# Patient Record
Sex: Female | Born: 1995 | Race: Black or African American | Hispanic: No | Marital: Single | Smoking: Former smoker
Health system: Southern US, Community
[De-identification: ages and names within clinical notes are randomized; demographics above are authoritative.]

## PROBLEM LIST (undated history)

## (undated) ENCOUNTER — Inpatient Hospital Stay (HOSPITAL_COMMUNITY): Payer: Self-pay

## (undated) DIAGNOSIS — E162 Hypoglycemia, unspecified: Secondary | ICD-10-CM

## (undated) DIAGNOSIS — D649 Anemia, unspecified: Secondary | ICD-10-CM

## (undated) DIAGNOSIS — E559 Vitamin D deficiency, unspecified: Secondary | ICD-10-CM

## (undated) DIAGNOSIS — A749 Chlamydial infection, unspecified: Secondary | ICD-10-CM

## (undated) DIAGNOSIS — R519 Headache, unspecified: Secondary | ICD-10-CM

## (undated) DIAGNOSIS — A549 Gonococcal infection, unspecified: Secondary | ICD-10-CM

## (undated) DIAGNOSIS — I10 Essential (primary) hypertension: Secondary | ICD-10-CM

## (undated) DIAGNOSIS — N83209 Unspecified ovarian cyst, unspecified side: Secondary | ICD-10-CM

## (undated) DIAGNOSIS — E785 Hyperlipidemia, unspecified: Secondary | ICD-10-CM

## (undated) HISTORY — DX: Vitamin D deficiency, unspecified: E55.9

## (undated) HISTORY — DX: Hyperlipidemia, unspecified: E78.5

## (undated) HISTORY — PX: HERNIA REPAIR: SHX51

## (undated) HISTORY — PX: UMBILICAL HERNIA REPAIR: SHX196

---
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
## 2016-01-27 ENCOUNTER — Ambulatory Visit (INDEPENDENT_AMBULATORY_CARE_PROVIDER_SITE_OTHER): Payer: BLUE CROSS/BLUE SHIELD | Admitting: Family Medicine

## 2016-01-27 VITALS — BP 132/88 | HR 97 | Temp 98.2°F | Resp 16 | Ht 64.0 in | Wt 137.0 lb

## 2016-01-27 DIAGNOSIS — R350 Frequency of micturition: Secondary | ICD-10-CM

## 2016-01-27 DIAGNOSIS — R102 Pelvic and perineal pain: Secondary | ICD-10-CM

## 2016-01-27 DIAGNOSIS — N938 Other specified abnormal uterine and vaginal bleeding: Secondary | ICD-10-CM

## 2016-01-27 DIAGNOSIS — N898 Other specified noninflammatory disorders of vagina: Secondary | ICD-10-CM

## 2016-01-27 DIAGNOSIS — R1030 Lower abdominal pain, unspecified: Secondary | ICD-10-CM | POA: Diagnosis not present

## 2016-01-27 LAB — COMPREHENSIVE METABOLIC PANEL
ALBUMIN: 4.8 g/dL (ref 3.6–5.1)
ALK PHOS: 50 U/L (ref 47–176)
ALT: 10 U/L (ref 5–32)
AST: 15 U/L (ref 12–32)
BILIRUBIN TOTAL: 0.9 mg/dL (ref 0.2–1.1)
BUN: 9 mg/dL (ref 7–20)
CO2: 21 mmol/L (ref 20–31)
CREATININE: 0.89 mg/dL (ref 0.50–1.00)
Calcium: 10.1 mg/dL (ref 8.9–10.4)
Chloride: 105 mmol/L (ref 98–110)
Glucose, Bld: 71 mg/dL (ref 65–99)
Potassium: 4.4 mmol/L (ref 3.8–5.1)
Sodium: 143 mmol/L (ref 135–146)
TOTAL PROTEIN: 7.7 g/dL (ref 6.3–8.2)

## 2016-01-27 LAB — POCT CBC
Granulocyte percent: 63.5 %G (ref 37–80)
HEMATOCRIT: 37.5 % — AB (ref 37.7–47.9)
Hemoglobin: 13 g/dL (ref 12.2–16.2)
LYMPH, POC: 2.3 (ref 0.6–3.4)
MCH, POC: 27.9 pg (ref 27–31.2)
MCHC: 34.7 g/dL (ref 31.8–35.4)
MCV: 80.5 fL (ref 80–97)
MID (CBC): 0.3 (ref 0–0.9)
MPV: 7.4 fL (ref 0–99.8)
POC GRANULOCYTE: 4.5 (ref 2–6.9)
POC LYMPH %: 32.7 % (ref 10–50)
POC MID %: 3.8 % (ref 0–12)
Platelet Count, POC: 349 10*3/uL (ref 142–424)
RBC: 4.67 M/uL (ref 4.04–5.48)
RDW, POC: 12.9 %
WBC: 7.1 10*3/uL (ref 4.6–10.2)

## 2016-01-27 LAB — POCT URINALYSIS DIP (MANUAL ENTRY)
Glucose, UA: NEGATIVE
Leukocytes, UA: NEGATIVE
Nitrite, UA: NEGATIVE
PH UA: 6.5
Protein Ur, POC: >=300 — AB
Urobilinogen, UA: 0.2

## 2016-01-27 LAB — POC MICROSCOPIC URINALYSIS (UMFC)

## 2016-01-27 MED ORDER — AZITHROMYCIN 500 MG PO TABS: 1000.0000 mg | ORAL_TABLET | Freq: Every day | ORAL | Status: DC

## 2016-01-27 MED ORDER — METRONIDAZOLE 500 MG PO TABS: 500.0000 mg | ORAL_TABLET | Freq: Two times a day (BID) | ORAL | Status: DC

## 2016-01-27 MED ORDER — DOXYCYCLINE HYCLATE 100 MG PO CAPS: 100.0000 mg | ORAL_CAPSULE | Freq: Two times a day (BID) | ORAL | Status: DC

## 2016-01-27 NOTE — Patient Instructions (Addendum)
   IF you received an x-ray today, you will receive an invoice from [facility]. Please contact [facility] at [phone] with questions or concerns regarding your invoice.   IF you received labwork today, you will receive an invoice from [facility]. Please contact [facility] at [phone] with questions or concerns regarding your invoice.   Our billing staff will not be able to assist you with questions regarding bills from these companies.  You will be contacted with the lab results as soon as they are available. The fastest way to get your results is to activate your My Chart account. Instructions are located on the last page of this paperwork. If you have not heard from us regarding the results in 2 weeks, please contact this office.      Pelvic Inflammatory Disease Pelvic inflammatory disease (PID) refers to an infection in some or all of the female organs. The infection can be in the uterus, ovaries, fallopian tubes, or the surrounding tissues in the pelvis. PID can cause abdominal or pelvic pain that comes on suddenly (acute pelvic pain). PID is a serious infection because it can lead to lasting (chronic) pelvic pain or the inability to have children (infertility). CAUSES This condition is most often caused by an infection that is spread during sexual contact. However, the infection can also be caused by the normal bacteria that are found in the vaginal tissues if these bacteria travel upward into the reproductive organs. PID can also occur following:  The birth of a baby.  A miscarriage.  An abortion.  Major pelvic surgery.  The use of an intrauterine device (IUD).  A sexual assault. RISK FACTORS This condition is more likely to develop in women who:  Are younger than 19 years of age.  Are sexually active at ayoung age.  Use nonbarrier contraception.  Have multiple sexual partners.  Have sex with someone who has  symptoms of an STD (sexually transmitted disease).  Use oral contraception. At times, certain behaviors can also increase the possibility of getting PID, such as:  Using a vaginal douche.  Having an IUD in place. SYMPTOMS Symptoms of this condition include:  Abdominal or pelvic pain.  Fever.  Chills.  Abnormal vaginal discharge.  Abnormal uterine bleeding.  Unusual pain shortly after the end of a menstrual period.  Painful urination.  Pain with sexual intercourse.  Nausea and vomiting. DIAGNOSIS To diagnose this condition, your health care provider will do a physical exam and take your medical history. A pelvic exam typically reveals great tenderness in the uterus and the surrounding pelvic tissues. You may also have tests, such as:  Lab tests, including a pregnancy test, blood tests, and urine test.  Culture tests of the vagina and cervix to check for an STD.  Ultrasound.  A laparoscopic procedure to look inside the pelvis.  Examining vaginal secretions under a microscope. TREATMENT Treatment for this condition may involve one or more approaches.  Antibiotic medicines may be prescribed to be taken by mouth.  Sexual partners may need to be treated if the infection is caused by an STD.  For more severe cases, hospitalization may be needed to give antibiotics directly into a vein through an IV tube.  Surgery may be needed if other treatments do not help, but this is rare. It may take weeks until you are completely well. If you are diagnosed with PID, you should also be checked for human immunodeficiency virus (HIV). Your health care provider may test   again 3 months after treatment. You should not have unprotected sex. HOME CARE INSTRUCTIONS  Take over-the-counter and prescription medicines only as told by your health care provider.  If you were prescribed an antibiotic medicine, take it as told by your health care provider. Do not stop taking the antibiotic  even if you start to feel better.  Do not have sexual intercourse until treatment is completed or as told by your health care provider. If PID is confirmed, your recent sexual partners will need treatment, especially if you had unprotected sex.  Keep all follow-up visits as told by your health care provider. This is important. SEEK MEDICAL CARE IF:  You have increased or abnormal vaginal discharge.  Your pain does not improve.  You vomit.  You have a fever.  You cannot tolerate your medicines.  Your partner has an STD.  You have pain when you urinate. SEEK IMMEDIATE MEDICAL CARE IF:  You have increased abdominal or pelvic pain.  You have chills.  Your symptoms are not better in 72 hours even with treatment.   This information is not intended to replace advice given to you by your health care provider. Make sure you discuss any questions you have with your health care provider.   Document Released: 09/04/2005 Document Revised: 05/26/2015 Document Reviewed: 10/12/2014 Elsevier Interactive Patient Education [employer]2016 Else[employer] Inc.

## 2016-01-27 NOTE — Progress Notes (Addendum)
Subjective:    Patient ID: Monique Keith, female    DOB: 06/21/1996, 20 y.o.   MRN: [id number] By signing my name below, I, Monique Keith, attest that this documentation has been prepared under the direction and in the presence of Monique SorensonEva Shaw, MD. Electronically Signed: Javier Dockerobert Ryan Keith, ER Scribe. 01/27/2016. 1:54 PM.  Chief Complaint  Patient presents with  . Exposure to STD    pt. wants test for chlamydia    HPI HPI Comments: Monique ProctorMiyanna Gonnella is a 20 y.o. female who [facility] complaining of vaginal discharge and odor reminescent of her prior chlamydia infection several months ago. She did have intercourse with the same person after her treatment - she thinks he was treated as well but he is no longer in the picture.  She is experience a vaginal odor with a yellow discharge, urinary frequency, dysuria, and some flank pain and abdominal pain.  She is freq nauseated. Normal bowels. Periods very irregular, + bleeding after intercourse and dyspareunia. Bleeidng frequently and heavily  Pt is here with a 5 mo infant today - her sister's child - so pt declines a pelvic exam   Past Medical History  Diagnosis Date  . Hypoglycemia   . Hypertension    No Known Allergies  No current outpatient prescriptions on file prior to visit.   No current facility-administered medications on file prior to visit.   Past Surgical History  Procedure Laterality Date  . Umbilical hernia repair     History reviewed. No pertinent family history. Social History   Social History  . Marital Status: Single    Spouse Name: N/A  . Number of Children: N/A  . Years of Education: N/A   Social History Main Topics  . Smoking status: Never Smoker   . Smokeless tobacco: Never Used  . Alcohol Use: No  . Drug Use: No  . Sexual Activity: Yes    Birth Control/ Protection: Pill   Other Topics Concern  . None   Social History Narrative     Review of Systems  Constitutional: Negative for fever,  chills, diaphoresis, activity change and appetite change.  Gastrointestinal: Positive for nausea and abdominal pain. Negative for vomiting, diarrhea, constipation and abdominal distention.  Genitourinary: Positive for dysuria, frequency, flank pain, vaginal bleeding, vaginal discharge, vaginal pain, menstrual problem and dyspareunia. Negative for genital sores.  Hematological: Negative for adenopathy.       Objective:  BP 132/88 mmHg  Pulse 97  Temp(Src) 98.2 F (36.8 C) (Oral)  Resp 16  Ht 5\' 4"  (1.626 m)  Wt 137 lb (62.143 kg)  BMI 23.50 kg/m2  SpO2 99%  Physical Exam  Constitutional: She is oriented to person, place, and time. She appears well-developed and well-nourished. No distress.  HENT:  Head: Normocephalic and atraumatic.  Eyes: Pupils are equal, round, and reactive to light.  Neck: Neck supple.  Cardiovascular: Normal rate.   Pulmonary/Chest: Effort normal. No respiratory distress.  Musculoskeletal: Normal range of motion.  Neurological: She is alert and oriented to person, place, and time. Coordination normal.  Skin: Skin is warm and dry. She is not diaphoretic.  Psychiatric: She has a normal mood and affect. Her behavior is normal.  Nursing note and vitals reviewed.     Assessment & Plan:   Concern for pid so will treat for chlamydia as well as start doxy and flagyl. Recheck in 2-3 wks after antibiotic course is complete.  Recommended IM ceftriaxone but pt refuses, will come back  if gonorrhea is +.   Recommended pelvic exam but pt refuses today as she has infant with her, also does not want a self-collected wet prep - agrees to do pelvic at f/u OV.  Recheck UA at f/u OV as well - sooner if sxs persist. Dehydrated -> increase h20.  1. Vaginal discharge   2. Urinary frequency   3. Pelvic pain in female   4. Dysfunctional uterine bleeding   5. Lower abdominal pain     Orders Placed This Encounter  Procedures  . GC/Chlamydia Probe Amp    Order Specific  Question:  Source    Answer:  urine  . Trichomonas vaginalis, RNA  . HIV antibody  . RPR  . Hepatitis C Antibody  . Comprehensive metabolic panel  . POCT CBC  . POCT urinalysis dipstick  . POCT Microscopic Urinalysis ([facility])    Meds ordered this encounter  Medications  . medroxyPROGESTERone (DEPO-PROVERA) 150 MG/ML injection    Sig: Inject 150 mg into the muscle every 3 (three) months.  Marland Kitchen azithromycin (ZITHROMAX) 500 MG tablet    Sig: Take 2 tablets (1,000 mg total) by mouth daily.    Dispense:  2 tablet    Refill:  0  . metroNIDAZOLE (FLAGYL) 500 MG tablet    Sig: Take 1 tablet (500 mg total) by mouth 2 (two) times daily.    Dispense:  28 tablet    Refill:  0  . doxycycline (VIBRAMYCIN) 100 MG capsule    Sig: Take 1 capsule (100 mg total) by mouth 2 (two) times daily.    Dispense:  28 capsule    Refill:  0     Monique Sorenson, MD MPH   Results for orders placed or performed in visit on 01/27/16  GC/Chlamydia Probe Amp  Result Value Ref Range   CT Probe RNA NOT DETECTED    GC Probe RNA NOT DETECTED   HIV antibody  Result Value Ref Range   HIV 1&2 Ab, 4th Generation NONREACTIVE NONREACTIVE  RPR  Result Value Ref Range   RPR Ser Ql NON REAC NON REAC  Hepatitis C Antibody  Result Value Ref Range   HCV Ab NEGATIVE NEGATIVE  Comprehensive metabolic panel  Result Value Ref Range   Sodium 143 135 - 146 mmol/L   Potassium 4.4 3.8 - 5.1 mmol/L   Chloride 105 98 - 110 mmol/L   CO2 21 20 - 31 mmol/L   Glucose, Bld 71 65 - 99 mg/dL   BUN 9 7 - 20 mg/dL   Creat 1.61 0.96 - 0.45 mg/dL   Total Bilirubin 0.9 0.2 - 1.1 mg/dL   Alkaline Phosphatase 50 47 - 176 U/L   AST 15 12 - 32 U/L   ALT 10 5 - 32 U/L   Total Protein 7.7 6.3 - 8.2 g/dL   Albumin 4.8 3.6 - 5.1 g/dL   Calcium 40.9 8.9 - 81.1 mg/dL  POCT CBC  Result Value Ref Range   WBC 7.1 4.6 - 10.2 K/uL   Lymph, poc 2.3 0.6 - 3.4   POC LYMPH PERCENT 32.7 10 - 50 %L   MID (cbc) 0.3 0 - 0.9   POC MID % 3.8 0 - 12 %M    POC Granulocyte 4.5 2 - 6.9   Granulocyte percent 63.5 37 - 80 %G   RBC 4.67 4.04 - 5.48 M/uL   Hemoglobin 13.0 12.2 - 16.2 g/dL   HCT, POC 91.4 (A) 78.2 - 47.9 %   MCV 80.5  80 - 97 fL   MCH, POC 27.9 27 - 31.2 pg   MCHC 34.7 31.8 - 35.4 g/dL   RDW, POC 16.1 %   Platelet Count, POC 349 142 - 424 K/uL   MPV 7.4 0 - 99.8 fL  POCT urinalysis dipstick  Result Value Ref Range   Color, UA yellow yellow   Clarity, UA hazy (A) clear   Glucose, UA negative negative   Bilirubin, UA small (A) negative   Ketones, POC UA >= (160) (A) negative   Spec Grav, UA >=1.030    Blood, UA large (A) negative   pH, UA 6.5    Protein Ur, POC >=300 (A) negative   Urobilinogen, UA 0.2    Nitrite, UA Negative Negative   Leukocytes, UA Negative Negative  POCT Microscopic Urinalysis ([facility])  Result Value Ref Range   WBC,UR,HPF,POC Many (A) None WBC/hpf   RBC,UR,HPF,POC Few (A) None RBC/hpf   Bacteria Many (A) None, Too numerous to count   Mucus Present (A) Absent   Epithelial Cells, UR Per Microscopy Many (A) None, Too numerous to count cells/hpf

## 2016-01-28 ENCOUNTER — Encounter: Payer: Self-pay | Admitting: Family Medicine

## 2016-01-28 LAB — GC/CHLAMYDIA PROBE AMP
CT Probe RNA: NOT DETECTED
GC Probe RNA: NOT DETECTED

## 2016-01-28 LAB — RPR

## 2016-01-28 LAB — HEPATITIS C ANTIBODY: HCV Ab: NEGATIVE

## 2016-01-28 LAB — HIV ANTIBODY (ROUTINE TESTING W REFLEX): HIV 1&2 Ab, 4th Generation: NONREACTIVE

## 2016-01-29 LAB — TRICHOMONAS VAGINALIS, PROBE AMP: T vaginalis RNA: NEGATIVE

## 2016-01-31 ENCOUNTER — Telehealth: Payer: Self-pay

## 2016-01-31 NOTE — Telephone Encounter (Signed)
The prescriptions were all sent directly over to her pharmacy so I guess she was unaware of this or she lost the pill bottles. Either way - all of her tests std came back negative so if she is still having symptoms, she needs to come back in for a pelvic exam as could be something else like BV or yeast. Will consider replacing the metronidazole but no indication for the doxy or azithro due to neg gc/chlam tests.

## 2016-01-31 NOTE — Telephone Encounter (Signed)
Pt is needing a refill on her medication that was prescribed for a STD she lost the RX   Best number [phone]

## 2016-01-31 NOTE — Telephone Encounter (Signed)
Dr. Shaw please review and advise. 

## 2016-02-01 NOTE — Telephone Encounter (Signed)
Left message for pt to call back  °

## 2016-06-25 ENCOUNTER — Emergency Department (HOSPITAL_COMMUNITY)
Admission: EM | Admit: 2016-06-25 | Discharge: 2016-06-26 | Disposition: A | Discharge status: Home / Self Care | Payer: BLUE CROSS/BLUE SHIELD | Attending: Emergency Medicine | Admitting: Emergency Medicine

## 2016-06-25 ENCOUNTER — Encounter (HOSPITAL_COMMUNITY): Payer: Self-pay | Admitting: Emergency Medicine

## 2016-06-25 ENCOUNTER — Ambulatory Visit (HOSPITAL_COMMUNITY)
Admission: EM | Admit: 2016-06-25 | Discharge: 2016-06-25 | Disposition: A | Discharge status: Nursing Home (Includes ICF) | Payer: BLUE CROSS/BLUE SHIELD | Attending: Internal Medicine | Admitting: Internal Medicine

## 2016-06-25 ENCOUNTER — Emergency Department (HOSPITAL_COMMUNITY): Payer: BLUE CROSS/BLUE SHIELD

## 2016-06-25 DIAGNOSIS — Y939 Activity, unspecified: Secondary | ICD-10-CM | POA: Insufficient documentation

## 2016-06-25 DIAGNOSIS — Z79899 Other long term (current) drug therapy: Secondary | ICD-10-CM | POA: Insufficient documentation

## 2016-06-25 DIAGNOSIS — S161XXD Strain of muscle, fascia and tendon at neck level, subsequent encounter: Secondary | ICD-10-CM | POA: Diagnosis not present

## 2016-06-25 DIAGNOSIS — G44319 Acute post-traumatic headache, not intractable: Secondary | ICD-10-CM

## 2016-06-25 DIAGNOSIS — S0990XA Unspecified injury of head, initial encounter: Secondary | ICD-10-CM

## 2016-06-25 DIAGNOSIS — M7918 Myalgia, other site: Secondary | ICD-10-CM

## 2016-06-25 DIAGNOSIS — Y92410 Unspecified street and highway as the place of occurrence of the external cause: Secondary | ICD-10-CM | POA: Diagnosis not present

## 2016-06-25 DIAGNOSIS — M25511 Pain in right shoulder: Secondary | ICD-10-CM | POA: Diagnosis not present

## 2016-06-25 DIAGNOSIS — I10 Essential (primary) hypertension: Secondary | ICD-10-CM | POA: Diagnosis not present

## 2016-06-25 DIAGNOSIS — Y999 Unspecified external cause status: Secondary | ICD-10-CM | POA: Insufficient documentation

## 2016-06-25 DIAGNOSIS — M549 Dorsalgia, unspecified: Secondary | ICD-10-CM | POA: Insufficient documentation

## 2016-06-25 DIAGNOSIS — R1030 Lower abdominal pain, unspecified: Secondary | ICD-10-CM | POA: Diagnosis not present

## 2016-06-25 DIAGNOSIS — M25512 Pain in left shoulder: Secondary | ICD-10-CM

## 2016-06-25 LAB — COMPREHENSIVE METABOLIC PANEL
ALK PHOS: 47 U/L (ref 38–126)
ALT: 10 U/L — ABNORMAL LOW (ref 14–54)
ANION GAP: 9 (ref 5–15)
AST: 16 U/L (ref 15–41)
Albumin: 4.6 g/dL (ref 3.5–5.0)
BILIRUBIN TOTAL: 0.7 mg/dL (ref 0.3–1.2)
BUN: 11 mg/dL (ref 6–20)
CALCIUM: 9.6 mg/dL (ref 8.9–10.3)
CO2: 21 mmol/L — AB (ref 22–32)
Chloride: 109 mmol/L (ref 101–111)
Creatinine, Ser: 0.83 mg/dL (ref 0.44–1.00)
Glucose, Bld: 77 mg/dL (ref 65–99)
Potassium: 3.6 mmol/L (ref 3.5–5.1)
SODIUM: 139 mmol/L (ref 135–145)
TOTAL PROTEIN: 7.9 g/dL (ref 6.5–8.1)

## 2016-06-25 LAB — CBC WITH DIFFERENTIAL/PLATELET
Basophils Absolute: 0 10*3/uL (ref 0.0–0.1)
Basophils Relative: 1 %
EOS ABS: 0.1 10*3/uL (ref 0.0–0.7)
Eosinophils Relative: 2 %
HCT: 35.2 % — ABNORMAL LOW (ref 36.0–46.0)
HEMOGLOBIN: 12.1 g/dL (ref 12.0–15.0)
LYMPHS ABS: 2.5 10*3/uL (ref 0.7–4.0)
Lymphocytes Relative: 43 %
MCH: 27.5 pg (ref 26.0–34.0)
MCHC: 34.4 g/dL (ref 30.0–36.0)
MCV: 80 fL (ref 78.0–100.0)
MONOS PCT: 8 %
Monocytes Absolute: 0.5 10*3/uL (ref 0.1–1.0)
Neutro Abs: 2.7 10*3/uL (ref 1.7–7.7)
Neutrophils Relative %: 46 %
Platelets: 327 10*3/uL (ref 150–400)
RBC: 4.4 MIL/uL (ref 3.87–5.11)
RDW: 12.9 % (ref 11.5–15.5)
WBC: 5.8 10*3/uL (ref 4.0–10.5)

## 2016-06-25 LAB — I-STAT BETA HCG BLOOD, ED (MC, WL, AP ONLY): I-stat hCG, quantitative: <5 m[IU]/mL (ref <5)

## 2016-06-25 MED ORDER — HYDROCODONE-ACETAMINOPHEN 5-325 MG PO TABS
1.0000 | ORAL_TABLET | Freq: Once | ORAL | Status: AC
  Administered 2016-06-25: 1 via ORAL
  Filled 2016-06-25: qty 1

## 2016-06-25 MED ORDER — SODIUM CHLORIDE 0.9 % IV SOLN
Freq: Once | INTRAVENOUS | Status: AC
  Administered 2016-06-26: via INTRAVENOUS
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
## 2016-06-26 ENCOUNTER — Emergency Department (HOSPITAL_COMMUNITY): Payer: BLUE CROSS/BLUE SHIELD

## 2016-06-26 ENCOUNTER — Encounter (HOSPITAL_COMMUNITY): Payer: Self-pay

## 2016-06-26 MED ORDER — HYDROCODONE-ACETAMINOPHEN 5-325 MG PO TABS: 1.0000 | ORAL_TABLET | ORAL | 0 refills | Status: DC | PRN

## 2016-06-26 MED ORDER — IBUPROFEN 400 MG PO TABS: 400.0000 mg | ORAL_TABLET | Freq: Four times a day (QID) | ORAL | 0 refills | Status: DC | PRN

## 2016-06-26 MED ORDER — IOPAMIDOL (ISOVUE-300) INJECTION 61%
100.0000 mL | Freq: Once | INTRAVENOUS | Status: AC | PRN
  Administered 2016-06-26: 100 mL via INTRAVENOUS
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
## 2016-06-28 ENCOUNTER — Ambulatory Visit (HOSPITAL_COMMUNITY)
Admission: EM | Admit: 2016-06-28 | Discharge: 2016-06-28 | Disposition: A | Discharge status: Home / Self Care | Payer: BLUE CROSS/BLUE SHIELD | Attending: Physician Assistant | Admitting: Physician Assistant

## 2016-06-28 ENCOUNTER — Encounter (HOSPITAL_COMMUNITY): Payer: Self-pay | Admitting: Emergency Medicine

## 2016-06-28 ENCOUNTER — Ambulatory Visit (INDEPENDENT_AMBULATORY_CARE_PROVIDER_SITE_OTHER): Payer: Self-pay

## 2016-06-28 DIAGNOSIS — S43401A Unspecified sprain of right shoulder joint, initial encounter: Secondary | ICD-10-CM | POA: Diagnosis not present

## 2016-06-28 MED ORDER — DICLOFENAC POTASSIUM 50 MG PO TABS: 50.0000 mg | ORAL_TABLET | Freq: Three times a day (TID) | ORAL | 1 refills | Status: DC
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
## 2016-07-04 ENCOUNTER — Ambulatory Visit (HOSPITAL_COMMUNITY)
Admission: EM | Admit: 2016-07-04 | Discharge: 2016-07-04 | Disposition: A | Discharge status: Home / Self Care | Payer: BLUE CROSS/BLUE SHIELD | Attending: Family Medicine | Admitting: Family Medicine

## 2016-07-04 ENCOUNTER — Encounter (HOSPITAL_COMMUNITY): Payer: Self-pay | Admitting: Emergency Medicine

## 2016-07-04 DIAGNOSIS — S46911D Strain of unspecified muscle, fascia and tendon at shoulder and upper arm level, right arm, subsequent encounter: Secondary | ICD-10-CM

## 2016-07-04 NOTE — Discharge Instructions (Signed)
You can be seen at the after hours orthopedic clinic after 5 pm today (see the handout).  They will set you up with further guidelines including work note and physical therapy
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
## 2016-11-01 ENCOUNTER — Emergency Department (HOSPITAL_COMMUNITY): Payer: BLUE CROSS/BLUE SHIELD

## 2016-11-01 ENCOUNTER — Encounter (HOSPITAL_COMMUNITY): Payer: Self-pay | Admitting: Emergency Medicine

## 2016-11-01 ENCOUNTER — Emergency Department (HOSPITAL_COMMUNITY)
Admission: EM | Admit: 2016-11-01 | Discharge: 2016-11-01 | Disposition: A | Discharge status: Home / Self Care | Payer: BLUE CROSS/BLUE SHIELD | Attending: Emergency Medicine | Admitting: Emergency Medicine

## 2016-11-01 DIAGNOSIS — I10 Essential (primary) hypertension: Secondary | ICD-10-CM | POA: Diagnosis not present

## 2016-11-01 DIAGNOSIS — R05 Cough: Secondary | ICD-10-CM | POA: Diagnosis present

## 2016-11-01 DIAGNOSIS — J40 Bronchitis, not specified as acute or chronic: Secondary | ICD-10-CM | POA: Diagnosis not present

## 2016-11-01 DIAGNOSIS — J9801 Acute bronchospasm: Secondary | ICD-10-CM | POA: Insufficient documentation

## 2016-11-01 LAB — I-STAT CHEM 8, ED
BUN: 7 mg/dL (ref 6–20)
CHLORIDE: 108 mmol/L (ref 101–111)
CREATININE: 0.8 mg/dL (ref 0.44–1.00)
Calcium, Ion: 1.21 mmol/L (ref 1.15–1.40)
Glucose, Bld: 80 mg/dL (ref 65–99)
HEMATOCRIT: 35 % — AB (ref 36.0–46.0)
Hemoglobin: 11.9 g/dL — ABNORMAL LOW (ref 12.0–15.0)
POTASSIUM: 3.5 mmol/L (ref 3.5–5.1)
SODIUM: 141 mmol/L (ref 135–145)
TCO2: 23 mmol/L (ref 0–100)

## 2016-11-01 LAB — I-STAT BETA HCG BLOOD, ED (MC, WL, AP ONLY)

## 2016-11-01 LAB — D-DIMER, QUANTITATIVE: D-Dimer, Quant: 0.33 ug/mL-FEU (ref 0.00–0.50)

## 2016-11-01 MED ORDER — ONDANSETRON 8 MG PO TBDP
8.0000 mg | ORAL_TABLET | Freq: Once | ORAL | Status: AC
  Administered 2016-11-01: 8 mg via ORAL
  Filled 2016-11-01: qty 1

## 2016-11-01 MED ORDER — KETOROLAC TROMETHAMINE 60 MG/2ML IM SOLN
60.0000 mg | Freq: Once | INTRAMUSCULAR | Status: AC
  Administered 2016-11-01: 60 mg via INTRAMUSCULAR
  Filled 2016-11-01: qty 2

## 2016-11-01 MED ORDER — IBUPROFEN 200 MG PO TABS: 600.0000 mg | ORAL_TABLET | Freq: Once | ORAL | Status: DC

## 2016-11-01 MED ORDER — ALBUTEROL SULFATE HFA 108 (90 BASE) MCG/ACT IN AERS
2.0000 | INHALATION_SPRAY | Freq: Once | RESPIRATORY_TRACT | Status: AC
  Administered 2016-11-01: 2 via RESPIRATORY_TRACT
  Filled 2016-11-01: qty 6.7

## 2016-11-01 NOTE — ED Provider Notes (Signed)
[facility] Provider Note   CSN: [id number] Arrival date & time: 11/01/16  [id number]     History   Chief Complaint Chief Complaint  Patient presents with  . Cough  . Shortness of Breath  . Nausea    HPI Monique Keith is a 21 y.o. female.  HPI Patient presents to the emergency department with complaints of nasal congestion cough and mild shortness of breath as well as chest pain when coughing.  She denies fevers and chills.  No unilateral leg swelling.  No history DVT or pulmonary embolism.  Patient ports mild decreased oral intake over the past 24 hours.  Denies nausea vomiting diarrhea.  No fevers or chills.  No other complaints.  No recent sick contacts.   Past Medical History:  Diagnosis Date  . Hypertension   . Hypoglycemia     There are no active problems to display for this patient.   Past Surgical History:  Procedure Laterality Date  . UMBILICAL HERNIA REPAIR      OB History    No data available       Home Medications    Prior to Admission medications   Medication Sig Start Date End Date Taking? Authorizing Provider  medroxyPROGESTERone (DEPO-PROVERA) 150 MG/ML injection Inject 150 mg into the muscle every 3 (three) months.   Yes Historical Provider, MD    Family History No family history on file.  Social History Social History  Substance Use Topics  . Smoking status: Never Smoker  . Smokeless tobacco: Never Used  . Alcohol use No     Allergies   Patient has no known allergies.   Review of Systems Review of Systems  All other systems reviewed and are negative.    Physical Exam Updated Vital Signs BP 138/74   Pulse 110   Temp 98 F (36.7 C) (Oral)   Resp 18   SpO2 100%   Physical Exam  Constitutional: She is oriented to person, place, and time. She appears well-developed and well-nourished. No distress.  HENT:  Head: Normocephalic and atraumatic.  Eyes: EOM are normal.  Neck: Normal range of motion.  Cardiovascular:  Normal rate, regular rhythm and normal heart sounds.   Pulmonary/Chest: Effort normal.  Abdominal: Soft. She exhibits no distension. There is no tenderness.  Musculoskeletal: Normal range of motion.  Neurological: She is alert and oriented to person, place, and time.  Skin: Skin is warm and dry.  Psychiatric: She has a normal mood and affect. Judgment normal.  Nursing note and vitals reviewed.    ED Treatments / Results  Labs (all labs ordered are listed, but only abnormal results are displayed) Labs Reviewed  I-STAT CHEM 8, ED - Abnormal; Notable for the following:       Result Value   Hemoglobin 11.9 (*)    HCT 35.0 (*)    All other components within normal limits  D-DIMER, QUANTITATIVE (NOT AT [facility])  I-STAT BETA HCG BLOOD, ED ([facility], [facility], [facility] ONLY)    EKG  EKG Interpretation None       Radiology Dg Chest 2 View  Result Date: 11/01/2016 CLINICAL DATA:  21 year old female with cough congestion shortness of breath and chest pain since last night. Initial encounter. EXAM: CHEST  2 VIEW COMPARISON:  07/03/2015 and earlier. FINDINGS: Low normal lung volumes. Normal cardiac size and mediastinal contours. Visualized tracheal air column is within normal limits. No pneumothorax, pulmonary edema, pleural effusion or confluent pulmonary opacity. No acute osseous abnormality identified. Slight levoconvex scoliosis. Negative  visible bowel gas pattern. IMPRESSION: No acute cardiopulmonary abnormality. Electronically Signed   By: Odessa Fleming M.D.   On: 11/01/2016 07:53    Procedures Procedures (including critical care time)  Medications Ordered in ED Medications  albuterol (PROVENTIL HFA;VENTOLIN HFA) 108 (90 Base) MCG/ACT inhaler 2 puff (2 puffs Inhalation Given 11/01/16 0900)  ondansetron (ZOFRAN-ODT) disintegrating tablet 8 mg (8 mg Oral Given 11/01/16 0900)  ketorolac (TORADOL) injection 60 mg (60 mg Intramuscular Given 11/01/16 0902)     Initial Impression / Assessment and Plan / ED  Course  I have reviewed the triage vital signs and the nursing notes.  Pertinent labs & imaging results that were available during my care of the patient were reviewed by me and considered in my medical decision making (see chart for details).     Likely bronchitis with bronchospasm.  Chest x-ray without abnormality.  Normal hemoglobin.  D-dimer negative.  Feels better after albuterol.  Primary care follow-up.  She understands return to the ER for new or worsening symptoms  Final Clinical Impressions(s) / ED Diagnoses   Final diagnoses:  Bronchitis  Bronchospasm    New Prescriptions Current Discharge Medication List       Azalia Bilis, MD 11/01/16 1027

## 2016-11-01 NOTE — ED Triage Notes (Signed)
Pt reports cold symptoms, nasal congestion, cough , shortness of breath and chest pain when coughing. No fever . A and O x 4

## 2016-11-04 DIAGNOSIS — J40 Bronchitis, not specified as acute or chronic: Secondary | ICD-10-CM | POA: Diagnosis not present

## 2016-11-04 DIAGNOSIS — I10 Essential (primary) hypertension: Secondary | ICD-10-CM | POA: Insufficient documentation

## 2016-11-04 DIAGNOSIS — Z79899 Other long term (current) drug therapy: Secondary | ICD-10-CM | POA: Diagnosis not present

## 2016-11-04 DIAGNOSIS — R1013 Epigastric pain: Secondary | ICD-10-CM | POA: Insufficient documentation

## 2016-11-04 DIAGNOSIS — R05 Cough: Secondary | ICD-10-CM | POA: Diagnosis present

## 2016-11-05 ENCOUNTER — Emergency Department (HOSPITAL_COMMUNITY): Payer: BLUE CROSS/BLUE SHIELD

## 2016-11-05 ENCOUNTER — Encounter (HOSPITAL_COMMUNITY): Payer: Self-pay | Admitting: *Deleted

## 2016-11-05 ENCOUNTER — Emergency Department (HOSPITAL_COMMUNITY)
Admission: EM | Admit: 2016-11-05 | Discharge: 2016-11-05 | Disposition: A | Discharge status: Home / Self Care | Payer: BLUE CROSS/BLUE SHIELD | Attending: Emergency Medicine | Admitting: Emergency Medicine

## 2016-11-05 DIAGNOSIS — R1013 Epigastric pain: Secondary | ICD-10-CM

## 2016-11-05 DIAGNOSIS — J40 Bronchitis, not specified as acute or chronic: Secondary | ICD-10-CM

## 2016-11-05 MED ORDER — IPRATROPIUM-ALBUTEROL 0.5-2.5 (3) MG/3ML IN SOLN
3.0000 mL | Freq: Once | RESPIRATORY_TRACT | Status: AC
  Administered 2016-11-05: 3 mL via RESPIRATORY_TRACT
  Filled 2016-11-05: qty 3

## 2016-11-05 MED ORDER — ONDANSETRON 4 MG PO TBDP
4.0000 mg | ORAL_TABLET | Freq: Three times a day (TID) | ORAL | 0 refills | Status: DC | PRN
Start: 2016-11-05 — End: 2018-10-28

## 2016-11-05 MED ORDER — GI COCKTAIL ~~LOC~~
30.0000 mL | Freq: Once | ORAL | Status: AC
  Administered 2016-11-05: 30 mL via ORAL
  Filled 2016-11-05: qty 30

## 2016-11-05 MED ORDER — PREDNISONE 20 MG PO TABS
60.0000 mg | ORAL_TABLET | Freq: Once | ORAL | Status: AC
  Administered 2016-11-05: 60 mg via ORAL
  Filled 2016-11-05: qty 3

## 2016-11-05 MED ORDER — PREDNISONE 20 MG PO TABS: 40.0000 mg | ORAL_TABLET | Freq: Every day | ORAL | 0 refills | Status: DC

## 2016-11-05 NOTE — ED Provider Notes (Signed)
[facility] Provider Note   CSN: [id number] Arrival date & time: 11/04/16  2359  By signing my name below, I, Linna DarnerRussell Turner, attest that this documentation has been prepared under the direction and in the presence of Terance HartKelly Joanathan Affeldt, PA-C. Electronically Signed: Linna Darnerussell Turner, Scribe. 11/05/2016. 1:02 AM.  History   Chief Complaint Chief Complaint  Patient presents with  . Cough    The history is provided by the patient. No language interpreter was used.    HPI Comments: Monique Keith is a 21 y.o. female who presents to the Emergency Department complaining of a persistent, gradually worsening cough for several days. Pt states her cough is productive with yellow/green sputum. She was seen here on 2/14 for cough, nasal congestion, mild SOB, and chest pain secondary to her cough and was diagnosed with bronchitis; she was prescribed an albuterol inhaler that has provided transient relief of her cough. She states that since this visit, her nasal congestion has resolved and she has developed nausea, generalized weakness/fatigue, body aches, chills, "night sweats", and periumbilical abdominal pain. Pt reports she is short of breath with any degree of exertion and becomes extremely fatigued with exertion as well. No SOB at rest. No h/o abdominal surgery. Pt is sexually active and last had intercourse a couple of weeks ago. She denies vomiting, constipation, diarrhea, dysuria, vaginal discharge, or any other associated symptoms.  Past Medical History:  Diagnosis Date  . Hypertension   . Hypoglycemia     There are no active problems to display for this patient.   Past Surgical History:  Procedure Laterality Date  . UMBILICAL HERNIA REPAIR      OB History    No data available       Home Medications    Prior to Admission medications   Medication Sig Start Date End Date Taking? Authorizing Provider  medroxyPROGESTERone (DEPO-PROVERA) 150 MG/ML injection Inject 150 mg into the  muscle every 3 (three) months.    Historical Provider, MD    Family History No family history on file.  Social History Social History  Substance Use Topics  . Smoking status: Never Smoker  . Smokeless tobacco: Never Used  . Alcohol use No     Allergies   Patient has no known allergies.   Review of Systems Review of Systems  Constitutional: Positive for chills, diaphoresis and fatigue.  Respiratory: Positive for cough and shortness of breath.   Cardiovascular: Positive for chest pain (secondary to cough).  Gastrointestinal: Positive for abdominal pain and nausea. Negative for constipation, diarrhea and vomiting.  Genitourinary: Negative for dysuria and vaginal discharge.  Musculoskeletal: Positive for myalgias.  Neurological: Positive for weakness (generalized).     Physical Exam Updated Vital Signs BP 124/86 (BP Location: Left Arm)   Pulse 97   Temp 98.3 F (36.8 C) (Oral)   Resp 18   SpO2 100%   Physical Exam  Constitutional: She is oriented to person, place, and time. She appears well-developed and well-nourished. No distress.  HENT:  Head: Normocephalic and atraumatic.  Eyes: Conjunctivae and EOM are normal.  Neck: Neck supple. No tracheal deviation present.  Cardiovascular: Normal rate and regular rhythm.  Exam reveals no gallop and no friction rub.   No murmur heard. Pulmonary/Chest: Effort normal. No respiratory distress. She has wheezes. She has no rales. She exhibits no tenderness.  Abdominal: Soft. Bowel sounds are normal. She exhibits no distension and no mass. There is tenderness (mild epigastric ). There is no rebound and no guarding. No  hernia.  Musculoskeletal: Normal range of motion.  Neurological: She is alert and oriented to person, place, and time.  Skin: Skin is warm and dry.  Psychiatric: She has a normal mood and affect. Her behavior is normal.  Nursing note and vitals reviewed.  ED Treatments / Results  Labs (all labs ordered are listed,  but only abnormal results are displayed) Labs Reviewed - No data to display  EKG  EKG Interpretation None       Radiology Dg Chest 2 View  Result Date: 11/05/2016 CLINICAL DATA:  Patient was diagnosed with bronchitis on Wednesday. Progressing cough. Body aches. Generalized weakness. EXAM: CHEST  2 VIEW COMPARISON:  11/01/2016 FINDINGS: The heart size and mediastinal contours are within normal limits. Both lungs are clear. The visualized skeletal structures are unremarkable. IMPRESSION: No active cardiopulmonary disease. Electronically Signed   By: Burman Nieves M.D.   On: 11/05/2016 01:12    Procedures Procedures (including critical care time)  DIAGNOSTIC STUDIES: Oxygen Saturation is 100% on RA, normal by my interpretation.    COORDINATION OF CARE: 1:13 AM Discussed treatment plan with pt at bedside and pt agreed to plan.  Medications Ordered in ED Medications  gi cocktail (Maalox,Lidocaine,Donnatal) (30 mLs Oral Given 11/05/16 0127)  ipratropium-albuterol (DUONEB) 0.5-2.5 (3) MG/3ML nebulizer solution 3 mL (3 mLs Nebulization Given 11/05/16 0130)  predniSONE (DELTASONE) tablet 60 mg (60 mg Oral Given 11/05/16 0127)     Initial Impression / Assessment and Plan / ED Course  I have reviewed the triage vital signs and the nursing notes.  Pertinent labs & imaging results that were available during my care of the patient were reviewed by me and considered in my medical decision making (see chart for details).  21 year old female presents with ongoing symptoms. Vitals are normal. She has mild wheezes. Duoneb and steroids given in ED. GI cocktail given with minimal relief. Ambulatory sats are 100%. CXR negative. Discussed supportive care. Rx for steroids and Zofran given. Return precautions given.  Final Clinical Impressions(s) / ED Diagnoses   Final diagnoses:  Bronchitis  Epigastric abdominal pain    New Prescriptions New Prescriptions   No medications on file   I  personally performed the services described in this documentation, which was scribed in my presence. The recorded information has been reviewed and is accurate.    Bethel Born, PA-C 11/05/16 1610    Tomasita Crumble, MD 11/05/16 [phone]

## 2016-11-05 NOTE — ED Triage Notes (Signed)
Pt states that she was seen on Wed and diagnosed with bronchitis; pt states that the cough has progressed and she is coughing up yellow phlegm; pt c/o body aches. Generalized weakness and just not feeling well; pt states "I feel like I am getting worse"

## 2016-11-05 NOTE — Discharge Instructions (Signed)
Continue using inhaler as needed for trouble breathing or wheezing Take Prednisone for the next 5 days Take Zofran as needed for nausea Return for worsening symptoms

## 2016-11-05 NOTE — ED Notes (Signed)
Ambulated patient in hallway and she tolerated it well. O2 stats were continuous at 100%.

## 2017-01-22 ENCOUNTER — Ambulatory Visit (HOSPITAL_COMMUNITY)
Admission: EM | Admit: 2017-01-22 | Discharge: 2017-01-22 | Disposition: A | Discharge status: Home / Self Care | Payer: BLUE CROSS/BLUE SHIELD | Attending: Internal Medicine | Admitting: Internal Medicine

## 2017-01-22 ENCOUNTER — Encounter (HOSPITAL_COMMUNITY): Payer: Self-pay | Admitting: Emergency Medicine

## 2017-01-22 DIAGNOSIS — B079 Viral wart, unspecified: Secondary | ICD-10-CM | POA: Diagnosis not present

## 2017-01-22 NOTE — ED Provider Notes (Signed)
CSN: [id number]     Arrival date & time 01/22/17  1337 History   First MD Initiated Contact with Patient 01/22/17 1555     Chief Complaint  Patient presents with  . Foot Swelling   (Consider location/radiation/quality/duration/timing/severity/associated sxs/prior Treatment) [age] female presents to clinic with a chief complaint of an area of swelling and discomfort on her left foot that is been present for several weeks. This is not interfering with walking, or daily activities.   The history is provided by the patient.    Past Medical History:  Diagnosis Date  . Hypertension   . Hypoglycemia    Past Surgical History:  Procedure Laterality Date  . UMBILICAL HERNIA REPAIR     History reviewed. No pertinent family history. Social History  Substance Use Topics  . Smoking status: Never Smoker  . Smokeless tobacco: Never Used  . Alcohol use No   OB History    No data available     Review of Systems  Constitutional: Negative.   Respiratory: Negative.   Cardiovascular: Negative.   Musculoskeletal: Negative.   Skin:       Bruising and swelling   Neurological: Negative.     Allergies  Patient has no known allergies.  Home Medications   Prior to Admission medications   Medication Sig Start Date End Date Taking? Authorizing Provider  medroxyPROGESTERone (DEPO-PROVERA) 150 MG/ML injection Inject 150 mg into the muscle every 3 (three) months.    [provider]  ondansetron (ZOFRAN ODT) 4 MG disintegrating tablet Take 1 tablet (4 mg total) by mouth every 8 (eight) hours as needed for nausea or vomiting. 11/05/16   Bethel BornGekas, Kelly Marie, PA-C  predniSONE (DELTASONE) 20 MG tablet Take 2 tablets (40 mg total) by mouth daily. 11/05/16   Bethel BornGekas, Kelly Marie, PA-C   Meds Ordered and Administered this Visit  Medications - No data to display  BP 123/69 (BP Location: Right Arm)   Pulse 80   Temp 98.9 F (37.2 C) (Oral)   Resp 18   SpO2 100%  No data found.   Physical Exam   Constitutional: She is oriented to person, place, and time. She appears well-developed and well-nourished. No distress.  HENT:  Head: Normocephalic and atraumatic.  Right Ear: External ear normal.  Left Ear: External ear normal.  Neurological: She is alert and oriented to person, place, and time.  Skin: Skin is warm and dry. Capillary refill takes less than 2 seconds. No rash noted. She is not diaphoretic. No erythema.  Small, 3 mm flesh colored raised lesion, left foot  Psychiatric: She has a normal mood and affect. Her behavior is normal.  Nursing note and vitals reviewed.   Urgent Care Course     Procedures (including critical care time)  Labs Review Labs Reviewed - No data to display  Imaging Review No results found.     MDM   1. Warts of foot    Recommend an OTC salicylate containing product for daily use for several weeks. If wart persists, follow up with podiatry or return to clinic as needed.    Dorena BodoKennard, Buffey Zabinski, NP 01/22/17 1616

## 2017-01-22 NOTE — ED Triage Notes (Signed)
Pt here for swelling of left foot and bruising that she noticed today  Denies inj/trauma, strenuous activity  A&O x4... NAD

## 2017-01-22 NOTE — Discharge Instructions (Signed)
Obtain an over the counter wart remover containing Salicylic acid, Comound W and Dr Idelle JoSholls both make a product, use it daily for several weeks, and this should remove the wart. If it persists, follow up with a podiatrist as needed.
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
## 2017-05-24 ENCOUNTER — Encounter (HOSPITAL_COMMUNITY): Payer: Self-pay | Admitting: Emergency Medicine

## 2017-05-24 ENCOUNTER — Emergency Department (HOSPITAL_COMMUNITY)
Admission: EM | Admit: 2017-05-24 | Discharge: 2017-05-24 | Disposition: A | Discharge status: Home / Self Care | Payer: BLUE CROSS/BLUE SHIELD | Attending: Emergency Medicine | Admitting: Emergency Medicine

## 2017-05-24 ENCOUNTER — Emergency Department (HOSPITAL_COMMUNITY): Payer: BLUE CROSS/BLUE SHIELD

## 2017-05-24 DIAGNOSIS — I10 Essential (primary) hypertension: Secondary | ICD-10-CM | POA: Diagnosis not present

## 2017-05-24 DIAGNOSIS — Y92488 Other paved roadways as the place of occurrence of the external cause: Secondary | ICD-10-CM | POA: Insufficient documentation

## 2017-05-24 DIAGNOSIS — W228XXA Striking against or struck by other objects, initial encounter: Secondary | ICD-10-CM | POA: Diagnosis not present

## 2017-05-24 DIAGNOSIS — M25572 Pain in left ankle and joints of left foot: Secondary | ICD-10-CM

## 2017-05-24 DIAGNOSIS — Z79899 Other long term (current) drug therapy: Secondary | ICD-10-CM | POA: Diagnosis not present

## 2017-05-24 DIAGNOSIS — S99922A Unspecified injury of left foot, initial encounter: Secondary | ICD-10-CM | POA: Diagnosis present

## 2017-05-24 DIAGNOSIS — S93402A Sprain of unspecified ligament of left ankle, initial encounter: Secondary | ICD-10-CM

## 2017-05-24 DIAGNOSIS — Y9389 Activity, other specified: Secondary | ICD-10-CM | POA: Insufficient documentation

## 2017-05-24 DIAGNOSIS — Y999 Unspecified external cause status: Secondary | ICD-10-CM | POA: Diagnosis not present

## 2017-05-24 MED ORDER — IBUPROFEN 200 MG PO TABS
600.0000 mg | ORAL_TABLET | Freq: Once | ORAL | Status: AC
  Administered 2017-05-24: 600 mg via ORAL
  Filled 2017-05-24: qty 3

## 2017-05-24 MED ORDER — IBUPROFEN 600 MG PO TABS: 600.0000 mg | ORAL_TABLET | Freq: Four times a day (QID) | ORAL | 0 refills | Status: DC | PRN
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
## 2017-05-24 NOTE — ED Notes (Signed)
Pt ambulatory and independent at discharge.  Verbalized understanding of discharge instructions 
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
## 2017-05-24 NOTE — ED Notes (Signed)
Bed: WTR9 Expected date:  Expected time:  Means of arrival:  Comments: Manson PasseyBrown

## 2017-08-19 ENCOUNTER — Emergency Department (HOSPITAL_COMMUNITY)
Admission: EM | Admit: 2017-08-19 | Discharge: 2017-08-19 | Disposition: A | Discharge status: Home / Self Care | Payer: BLUE CROSS/BLUE SHIELD | Attending: Emergency Medicine | Admitting: Emergency Medicine

## 2017-08-19 ENCOUNTER — Other Ambulatory Visit: Payer: Self-pay

## 2017-08-19 ENCOUNTER — Encounter (HOSPITAL_COMMUNITY): Payer: Self-pay | Admitting: *Deleted

## 2017-08-19 DIAGNOSIS — S39012A Strain of muscle, fascia and tendon of lower back, initial encounter: Secondary | ICD-10-CM | POA: Insufficient documentation

## 2017-08-19 DIAGNOSIS — I10 Essential (primary) hypertension: Secondary | ICD-10-CM | POA: Insufficient documentation

## 2017-08-19 DIAGNOSIS — Y999 Unspecified external cause status: Secondary | ICD-10-CM | POA: Diagnosis not present

## 2017-08-19 DIAGNOSIS — Y939 Activity, unspecified: Secondary | ICD-10-CM | POA: Insufficient documentation

## 2017-08-19 DIAGNOSIS — Y92410 Unspecified street and highway as the place of occurrence of the external cause: Secondary | ICD-10-CM | POA: Insufficient documentation

## 2017-08-19 DIAGNOSIS — T148XXA Other injury of unspecified body region, initial encounter: Secondary | ICD-10-CM

## 2017-08-19 DIAGNOSIS — Z79899 Other long term (current) drug therapy: Secondary | ICD-10-CM | POA: Insufficient documentation

## 2017-08-19 DIAGNOSIS — S299XXA Unspecified injury of thorax, initial encounter: Secondary | ICD-10-CM | POA: Diagnosis present

## 2017-08-19 LAB — POC URINE PREG, ED: Preg Test, Ur: NEGATIVE

## 2017-08-19 MED ORDER — CYCLOBENZAPRINE HCL 10 MG PO TABS: 10.0000 mg | ORAL_TABLET | Freq: Two times a day (BID) | ORAL | 0 refills | Status: DC | PRN
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
## 2018-02-15 ENCOUNTER — Encounter (HOSPITAL_COMMUNITY): Payer: Self-pay

## 2018-02-15 ENCOUNTER — Emergency Department (HOSPITAL_COMMUNITY): Payer: BLUE CROSS/BLUE SHIELD

## 2018-02-15 ENCOUNTER — Emergency Department (HOSPITAL_COMMUNITY)
Admission: EM | Admit: 2018-02-15 | Discharge: 2018-02-15 | Disposition: A | Discharge status: Home / Self Care | Payer: BLUE CROSS/BLUE SHIELD | Attending: Emergency Medicine | Admitting: Emergency Medicine

## 2018-02-15 ENCOUNTER — Other Ambulatory Visit: Payer: Self-pay

## 2018-02-15 DIAGNOSIS — M791 Myalgia, unspecified site: Secondary | ICD-10-CM | POA: Diagnosis not present

## 2018-02-15 DIAGNOSIS — Z79899 Other long term (current) drug therapy: Secondary | ICD-10-CM | POA: Diagnosis not present

## 2018-02-15 DIAGNOSIS — I10 Essential (primary) hypertension: Secondary | ICD-10-CM | POA: Insufficient documentation

## 2018-02-15 DIAGNOSIS — M545 Low back pain: Secondary | ICD-10-CM | POA: Diagnosis present

## 2018-02-15 LAB — POC URINE PREG, ED: PREG TEST UR: NEGATIVE

## 2018-02-15 MED ORDER — METHOCARBAMOL 500 MG PO TABS: 500.0000 mg | ORAL_TABLET | Freq: Three times a day (TID) | ORAL | 0 refills | Status: AC
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
## 2018-03-30 ENCOUNTER — Other Ambulatory Visit: Payer: Self-pay

## 2018-03-30 ENCOUNTER — Emergency Department (HOSPITAL_COMMUNITY): Payer: BLUE CROSS/BLUE SHIELD

## 2018-03-30 ENCOUNTER — Emergency Department (HOSPITAL_COMMUNITY)
Admission: EM | Admit: 2018-03-30 | Discharge: 2018-03-30 | Disposition: A | Discharge status: Home / Self Care | Payer: BLUE CROSS/BLUE SHIELD | Attending: Emergency Medicine | Admitting: Emergency Medicine

## 2018-03-30 ENCOUNTER — Encounter (HOSPITAL_COMMUNITY): Payer: Self-pay

## 2018-03-30 ENCOUNTER — Encounter (HOSPITAL_COMMUNITY): Payer: Self-pay | Admitting: Emergency Medicine

## 2018-03-30 DIAGNOSIS — Y998 Other external cause status: Secondary | ICD-10-CM | POA: Diagnosis not present

## 2018-03-30 DIAGNOSIS — Y92410 Unspecified street and highway as the place of occurrence of the external cause: Secondary | ICD-10-CM | POA: Diagnosis not present

## 2018-03-30 DIAGNOSIS — I10 Essential (primary) hypertension: Secondary | ICD-10-CM | POA: Diagnosis not present

## 2018-03-30 DIAGNOSIS — R519 Headache, unspecified: Secondary | ICD-10-CM

## 2018-03-30 DIAGNOSIS — Y939 Activity, unspecified: Secondary | ICD-10-CM | POA: Insufficient documentation

## 2018-03-30 DIAGNOSIS — Z5321 Procedure and treatment not carried out due to patient leaving prior to being seen by health care provider: Secondary | ICD-10-CM | POA: Insufficient documentation

## 2018-03-30 DIAGNOSIS — R51 Headache: Secondary | ICD-10-CM | POA: Diagnosis not present

## 2018-03-30 DIAGNOSIS — M545 Low back pain, unspecified: Secondary | ICD-10-CM

## 2018-03-30 DIAGNOSIS — M542 Cervicalgia: Secondary | ICD-10-CM | POA: Insufficient documentation

## 2018-03-30 LAB — POC URINE PREG, ED: Preg Test, Ur: NEGATIVE

## 2018-03-30 MED ORDER — CYCLOBENZAPRINE HCL 10 MG PO TABS: 10.0000 mg | ORAL_TABLET | Freq: Two times a day (BID) | ORAL | 0 refills | Status: DC | PRN

## 2018-03-30 MED ORDER — CYCLOBENZAPRINE HCL 10 MG PO TABS
5.0000 mg | ORAL_TABLET | Freq: Once | ORAL | Status: AC
  Administered 2018-03-30: 5 mg via ORAL
  Filled 2018-03-30: qty 1

## 2018-03-30 MED ORDER — ACETAMINOPHEN 500 MG PO TABS
1000.0000 mg | ORAL_TABLET | Freq: Once | ORAL | Status: AC
  Administered 2018-03-30: 1000 mg via ORAL
  Filled 2018-03-30: qty 2
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
## 2018-03-30 NOTE — Discharge Instructions (Signed)
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
## 2018-03-30 NOTE — ED Notes (Signed)
Patient transported to CT 

## 2018-10-02 ENCOUNTER — Encounter (HOSPITAL_COMMUNITY): Payer: Self-pay

## 2018-10-02 ENCOUNTER — Ambulatory Visit (INDEPENDENT_AMBULATORY_CARE_PROVIDER_SITE_OTHER)
Admission: EM | Admit: 2018-10-02 | Discharge: 2018-10-02 | Disposition: A | Discharge status: Home / Self Care | Payer: BLUE CROSS/BLUE SHIELD | Source: Home / Self Care | Attending: Family Medicine | Admitting: Family Medicine

## 2018-10-02 ENCOUNTER — Inpatient Hospital Stay (HOSPITAL_COMMUNITY)
Admission: AD | Admit: 2018-10-02 | Discharge: 2018-10-02 | Discharge status: Against Medical Advice | Payer: BLUE CROSS/BLUE SHIELD | Source: Ambulatory Visit | Attending: Obstetrics & Gynecology | Admitting: Obstetrics & Gynecology

## 2018-10-02 ENCOUNTER — Other Ambulatory Visit: Payer: Self-pay

## 2018-10-02 DIAGNOSIS — A5402 Gonococcal vulvovaginitis, unspecified: Secondary | ICD-10-CM | POA: Insufficient documentation

## 2018-10-02 DIAGNOSIS — R300 Dysuria: Secondary | ICD-10-CM

## 2018-10-02 DIAGNOSIS — Z8619 Personal history of other infectious and parasitic diseases: Secondary | ICD-10-CM | POA: Diagnosis not present

## 2018-10-02 DIAGNOSIS — A549 Gonococcal infection, unspecified: Secondary | ICD-10-CM | POA: Diagnosis not present

## 2018-10-02 DIAGNOSIS — R319 Hematuria, unspecified: Secondary | ICD-10-CM | POA: Diagnosis not present

## 2018-10-02 LAB — POCT URINALYSIS DIP (DEVICE)
Glucose, UA: NEGATIVE mg/dL
Nitrite: NEGATIVE
PH: 5.5 (ref 5.0–8.0)
Protein, ur: 30 mg/dL — AB
Specific Gravity, Urine: >=1.03 (ref 1.005–1.030)
Urobilinogen, UA: 0.2 mg/dL (ref 0.0–1.0)

## 2018-10-02 MED ORDER — AZITHROMYCIN 250 MG PO TABS
1000.0000 mg | ORAL_TABLET | Freq: Once | ORAL | Status: AC
  Administered 2018-10-02: 1000 mg via ORAL

## 2018-10-02 MED ORDER — CEFTRIAXONE SODIUM 250 MG IJ SOLR
250.0000 mg | Freq: Once | INTRAMUSCULAR | Status: AC
  Administered 2018-10-02: 250 mg via INTRAMUSCULAR

## 2018-10-02 MED ORDER — CEFTRIAXONE SODIUM 250 MG IJ SOLR
INTRAMUSCULAR | Status: AC
  Filled 2018-10-02: qty 250

## 2018-10-02 MED ORDER — AZITHROMYCIN 250 MG PO TABS
ORAL_TABLET | ORAL | Status: AC
  Filled 2018-10-02: qty 4

## 2018-10-02 NOTE — ED Triage Notes (Signed)
Pt presents for treatment of gonorrhea after receving a positive phone call a week ago from [facility].  [facility] but she did not want to be tested if she was not going to be treated before her results come back.

## 2018-10-02 NOTE — MAU Note (Signed)
 since last check, not in lobby

## 2018-10-02 NOTE — MAU Note (Signed)
Not in lobby

## 2018-10-02 NOTE — MAU Note (Signed)
Got tested last wk at [facility], received a call today that she had tested + for Gonorrhea.  Office closed today and tomorrow, was told she could wait until Friday  Or to go somewhere.  Wanting to get treated.

## 2018-10-02 NOTE — MAU Provider Note (Addendum)
[name] is a 23 y.o. female who presents to MAU today for treatment of Gonorrhea. She was informed today by [facility]. She was instructed to return on Friday for treatment as they are not open tomorrow.  BP 126/70 (BP Location: Right Arm)   Pulse 71   Temp 99.7 F (37.6 C) (Oral)   Resp 16   LMP 09/14/2018   SpO2 99%   CONSTITUTIONAL: Well-developed, well-nourished female in no acute distress.  MUSCULOSKELETAL: Normal range of motion.  CARDIOVASCULAR: Regular heart rate RESPIRATORY: Normal effort NEUROLOGICAL: Alert and oriented to person, place, and time.  SKIN: No pallor. PSYCH: Normal mood and affect. Normal behavior. Normal judgment and thought content.  No results found for this or any previous visit (from the past 24 hour(s)).  MDM No emergent condition identified today. Pt informed care may be sought in outpatient setting. Test result not available for review thererfore she [facility] or [facility].  A: History of gonorrhea  P: Pt left before discharge Follow up with PCP prn Patient [facility]  Donette LarryBhambri, Bradford Cazier, [place]  10/02/2018 5:09 PM

## 2018-10-02 NOTE — ED Provider Notes (Signed)
[facility]    CSN: [id number] Arrival date & time: 10/02/18  1730     History   Chief Complaint Chief Complaint  Patient presents with  . Exposure to STD    HPI Monique Keith is a 23 y.o. female.   Patient was seen at [facility] about a week ago and underwent STD testing.  She received a call that she had gonorrhea but has not yet been treated.  She has also been having some urinary tract symptoms with burning and now blood in her urine for the past few days.  She denies any lower abdominal pain or vaginal discharge HPI  Past Medical History:  Diagnosis Date  . Hypertension   . Hypoglycemia     There are no active problems to display for this patient.   Past Surgical History:  Procedure Laterality Date  . HERNIA REPAIR    . UMBILICAL HERNIA REPAIR      OB History   No obstetric history on file.      Home Medications    Prior to Admission medications   Medication Sig Start Date End Date Taking? Authorizing Provider  cyclobenzaprine (FLEXERIL) 10 MG tablet Take 1 tablet (10 mg total) by mouth 2 (two) times daily as needed for muscle spasms. 03/30/18   Fawze, Mina A, PA-C  ibuprofen (ADVIL,MOTRIN) 600 MG tablet Take 1 tablet (600 mg total) by mouth every 6 (six) hours as needed. Patient not taking: Reported on 03/30/2018 05/24/17   Audry Pili, PA-C  ondansetron (ZOFRAN ODT) 4 MG disintegrating tablet Take 1 tablet (4 mg total) by mouth every 8 (eight) hours as needed for nausea or vomiting. Patient not taking: Reported on 03/30/2018 11/05/16   Bethel Born, PA-C  predniSONE (DELTASONE) 20 MG tablet Take 2 tablets (40 mg total) by mouth daily. Patient not taking: Reported on 03/30/2018 11/05/16   Bethel Born, PA-C    Family History Family History  Problem Relation Age of Onset  . Hypertension Other   . Diabetes Other   . Hyperlipidemia Other     Social History Social History   Tobacco Use  . Smoking status: Never Smoker  .  Smokeless tobacco: Never Used  Substance Use Topics  . Alcohol use: No    Alcohol/week: 0.0 standard drinks  . Drug use: No     Allergies   Patient has no known allergies.   Review of Systems Review of Systems  Genitourinary: Positive for frequency and hematuria. Negative for vaginal bleeding, vaginal discharge and vaginal pain.  All other systems reviewed and are negative.    Physical Exam Triage Vital Signs ED Triage Vitals  Enc Vitals Group     BP 10/02/18 1759 126/72     Pulse Rate 10/02/18 1759 78     Resp 10/02/18 1759 16     Temp 10/02/18 1759 98.1 F (36.7 C)     Temp Source 10/02/18 1759 Oral     SpO2 10/02/18 1759 99 %     Weight --      Height --      Head Circumference --      Peak Flow --      Pain Score 10/02/18 1808 6     Pain Loc --      Pain Edu? --      Excl. in GC? --    No data found.  Updated Vital Signs BP 126/72 (BP Location: Left Arm)   Pulse 78   Temp 98.1  F (36.7 C) (Oral)   Resp 16   LMP 09/14/2018   SpO2 99%   Visual Acuity Right Eye Distance:   Left Eye Distance:   Bilateral Distance:    Right Eye Near:   Left Eye Near:    Bilateral Near:     Physical Exam Vitals signs and nursing note reviewed. Exam conducted with a chaperone present.  HENT:     Head: Normocephalic.  Cardiovascular:     Rate and Rhythm: Normal rate and regular rhythm.  Pulmonary:     Effort: Pulmonary effort is normal.     Breath sounds: Normal breath sounds.  Abdominal:     General: Bowel sounds are normal.     Palpations: Abdomen is soft.  Neurological:     Mental Status: She is alert.      UC Treatments / Results  Labs (all labs ordered are listed, but only abnormal results are displayed) Labs Reviewed  URINE CYTOLOGY ANCILLARY ONLY    EKG None  Radiology No results found.  Procedures Procedures (including critical care time)  Medications Ordered in UC Medications - No data to display  Initial Impression / Assessment and  Plan / UC Course  I have reviewed the triage vital signs and the nursing notes.  Pertinent labs & imaging results that were available during my care of the patient were reviewed by me and considered in my medical decision making (see chart for details).     Gonorrhea.  Will treat with Rocephin and azithromycin and repeat urine cytology.  Treatment is based on testing from [facility] 1 week ago Final Clinical Impressions(s) / UC Diagnoses   Final diagnoses:  None   Discharge Instructions   None    ED Prescriptions    None     Controlled Substance Prescriptions [place] Controlled Substance Registry consulted? No   Frederica KusterMiller,  M, MD 10/02/18 [phone]

## 2018-10-02 NOTE — ED Triage Notes (Signed)
Pt has complaints of burning, itching and blood in her urine for the past few days.

## 2018-10-03 LAB — URINE CYTOLOGY ANCILLARY ONLY
Chlamydia: NEGATIVE
Neisseria Gonorrhea: POSITIVE — AB
Trichomonas: NEGATIVE

## 2018-10-07 ENCOUNTER — Telehealth (HOSPITAL_COMMUNITY): Payer: Self-pay | Admitting: Emergency Medicine

## 2018-10-07 LAB — URINE CYTOLOGY ANCILLARY ONLY

## 2018-10-07 MED ORDER — METRONIDAZOLE 500 MG PO TABS: 500.0000 mg | ORAL_TABLET | Freq: Two times a day (BID) | ORAL | 0 refills | Status: DC

## 2018-10-07 NOTE — Telephone Encounter (Signed)
Bacterial vaginosis is positive. This was not treated at the urgent care visit.  Flagyl 500 mg BID x 7 days #14 no refills sent to patients pharmacy of choice.    Test for gonorrhea was positive. This was treated at the urgent care visit with IM rocephin 250mg and po zithromax 1g. Pt needs education to refrain from sexual intercourse for 7 days after treatment to give the medicine time to work. Sexual partners need to be notified and tested/treated. Condoms may reduce risk of reinfection. Recheck or followup with PCP for further evaluation if symptoms are not improving. [facility] notified.   Patient contacted and made aware of all results, all questions answered.   

## 2018-10-11 ENCOUNTER — Encounter (HOSPITAL_COMMUNITY): Payer: Self-pay | Admitting: *Deleted

## 2018-10-11 ENCOUNTER — Inpatient Hospital Stay (HOSPITAL_COMMUNITY): Payer: BLUE CROSS/BLUE SHIELD

## 2018-10-11 ENCOUNTER — Inpatient Hospital Stay (HOSPITAL_COMMUNITY)
Admission: AD | Admit: 2018-10-11 | Discharge: 2018-10-11 | Disposition: A | Discharge status: Home / Self Care | Payer: BLUE CROSS/BLUE SHIELD | Attending: Obstetrics and Gynecology | Admitting: Obstetrics and Gynecology

## 2018-10-11 DIAGNOSIS — R1030 Lower abdominal pain, unspecified: Secondary | ICD-10-CM | POA: Insufficient documentation

## 2018-10-11 DIAGNOSIS — Z3A01 Less than 8 weeks gestation of pregnancy: Secondary | ICD-10-CM | POA: Insufficient documentation

## 2018-10-11 DIAGNOSIS — O26891 Other specified pregnancy related conditions, first trimester: Secondary | ICD-10-CM | POA: Insufficient documentation

## 2018-10-11 DIAGNOSIS — Z3490 Encounter for supervision of normal pregnancy, unspecified, unspecified trimester: Secondary | ICD-10-CM

## 2018-10-11 DIAGNOSIS — R109 Unspecified abdominal pain: Secondary | ICD-10-CM

## 2018-10-11 DIAGNOSIS — O98211 Gonorrhea complicating pregnancy, first trimester: Secondary | ICD-10-CM | POA: Diagnosis not present

## 2018-10-11 DIAGNOSIS — R102 Pelvic and perineal pain: Secondary | ICD-10-CM | POA: Diagnosis not present

## 2018-10-11 HISTORY — DX: Chlamydial infection, unspecified: A74.9

## 2018-10-11 HISTORY — DX: Gonococcal infection, unspecified: A54.9

## 2018-10-11 LAB — CBC
HCT: 38.4 % (ref 36.0–46.0)
Hemoglobin: 12.6 g/dL (ref 12.0–15.0)
MCH: 28.1 pg (ref 26.0–34.0)
MCHC: 32.8 g/dL (ref 30.0–36.0)
MCV: 85.7 fL (ref 80.0–100.0)
NRBC: 0 % (ref 0.0–0.2)
Platelets: 351 10*3/uL (ref 150–400)
RBC: 4.48 MIL/uL (ref 3.87–5.11)
RDW: 13.5 % (ref 11.5–15.5)
WBC: 6.3 10*3/uL (ref 4.0–10.5)

## 2018-10-11 LAB — URINALYSIS, ROUTINE W REFLEX MICROSCOPIC
Bilirubin Urine: NEGATIVE
Glucose, UA: NEGATIVE mg/dL
HGB URINE DIPSTICK: NEGATIVE
Ketones, ur: NEGATIVE mg/dL
LEUKOCYTES UA: NEGATIVE
NITRITE: NEGATIVE
PROTEIN: NEGATIVE mg/dL
Specific Gravity, Urine: 1.016 (ref 1.005–1.030)
pH: 8 (ref 5.0–8.0)

## 2018-10-11 LAB — HCG, QUANTITATIVE, PREGNANCY: hCG, Beta Chain, Quant, S: 19113 m[IU]/mL — ABNORMAL HIGH (ref <5)

## 2018-10-11 LAB — HIV ANTIBODY (ROUTINE TESTING W REFLEX): HIV Screen 4th Generation wRfx: NONREACTIVE

## 2018-10-11 LAB — ABO/RH: ABO/RH(D): A POS

## 2018-10-11 LAB — POCT PREGNANCY, URINE: Preg Test, Ur: POSITIVE — AB

## 2018-10-11 MED ORDER — PREPLUS 27-1 MG PO TABS: 1.0000 | ORAL_TABLET | Freq: Every day | ORAL | 13 refills | Status: DC

## 2018-10-11 NOTE — Discharge Instructions (Signed)
First Trimester of Pregnancy  The first trimester of pregnancy is from week 1 until the end of week 13 (months 1 through 3). During this time, your baby will begin to develop inside you. At 6-8 weeks, the eyes and face are formed, and the heartbeat can be seen on ultrasound. At the end of 12 weeks, all the baby's organs are formed. Prenatal care is all the medical care you receive before the birth of your baby. Make sure you get good prenatal care and follow all of your doctor's instructions. Follow these instructions at home: Medicines  Take over-the-counter and prescription medicines only as told by your doctor. Some medicines are safe and some medicines are not safe during pregnancy.  Take a prenatal vitamin that contains at least 600 micrograms (mcg) of folic acid.  If you have trouble pooping (constipation), take medicine that will make your stool soft (stool softener) if your doctor approves. Eating and drinking   Eat regular, healthy meals.  Your doctor will tell you the amount of weight gain that is right for you.  Avoid raw meat and uncooked cheese.  If you feel sick to your stomach (nauseous) or throw up (vomit): ? Eat 4 or 5 small meals a day instead of 3 large meals. ? Try eating a few soda crackers. ? Drink liquids between meals instead of during meals.  To prevent constipation: ? Eat foods that are high in fiber, like fresh fruits and vegetables, whole grains, and beans. ? Drink enough fluids to keep your pee (urine) clear or pale yellow. Activity  Exercise only as told by your doctor. Stop exercising if you have cramps or pain in your lower belly (abdomen) or low back.  Do not exercise if it is too hot, too humid, or if you are in a place of great height (high altitude).  Try to avoid standing for long periods of time. Move your legs often if you must stand in one place for a long time.  Avoid heavy lifting.  Wear low-heeled shoes. Sit and stand up straight.   You can have sex unless your doctor tells you not to. Relieving pain and discomfort  Wear a good support bra if your breasts are sore.  Take warm water baths (sitz baths) to soothe pain or discomfort caused by hemorrhoids. Use hemorrhoid cream if your doctor says it is okay.  Rest with your legs raised if you have leg cramps or low back pain.  If you have puffy, bulging veins (varicose veins) in your legs: ? Wear support hose or compression stockings as told by your doctor. ? Raise (elevate) your feet for 15 minutes, 3-4 times a day. ? Limit salt in your food. Prenatal care  Schedule your prenatal visits by the twelfth week of pregnancy.  Write down your questions. Take them to your prenatal visits.  Keep all your prenatal visits as told by your doctor. This is important. Safety  Wear your seat belt at all times when driving.  Make a list of emergency phone numbers. The list should include numbers for family, friends, the hospital, and police and fire departments. General instructions  Ask your doctor for a referral to a local prenatal class. Begin classes no later than at the start of month 6 of your pregnancy.  Ask for help if you need counseling or if you need help with nutrition. Your doctor can give you advice or tell you where to go for help.  Do not use hot tubs, steam   rooms, or saunas.  Do not douche or use tampons or scented sanitary pads.  Do not cross your legs for long periods of time.  Avoid all herbs and alcohol. Avoid drugs that are not approved by your doctor.  Do not use any tobacco products, including cigarettes, chewing tobacco, and electronic cigarettes. If you need help quitting, ask your doctor. You may get counseling or other support to help you quit.  Avoid cat litter boxes and soil used by cats. These carry germs that can cause birth defects in the baby and can cause a loss of your baby (miscarriage) or stillbirth.  Visit your dentist. At home,  brush your teeth with a soft toothbrush. Be gentle when you floss. Contact a doctor if:  You are dizzy.  You have mild cramps or pressure in your lower belly.  You have a nagging pain in your belly area.  You continue to feel sick to your stomach, you throw up, or you have watery poop (diarrhea).  You have a bad smelling fluid coming from your vagina.  You have pain when you pee (urinate).  You have increased puffiness (swelling) in your face, hands, legs, or ankles. Get help right away if:  You have a fever.  You are leaking fluid from your vagina.  You have spotting or bleeding from your vagina.  You have very bad belly cramping or pain.  You gain or lose weight rapidly.  You throw up blood. It may look like coffee grounds.  You are around people who have German measles, fifth disease, or chickenpox.  You have a very bad headache.  You have shortness of breath.  You have any kind of trauma, such as from a fall or a car accident. Summary  The first trimester of pregnancy is from week 1 until the end of week 13 (months 1 through 3).  To take care of yourself and your unborn baby, you will need to eat healthy meals, take medicines only if your doctor tells you to do so, and do activities that are safe for you and your baby.  Keep all follow-up visits as told by your doctor. This is important as your doctor will have to ensure that your baby is healthy and growing well. This information is not intended to replace advice given to you by your health care provider. Make sure you discuss any questions you have with your health care provider. Document Released: 02/21/2008 Document Revised: 09/12/2016 Document Reviewed: 09/12/2016 Elsevier Interactive Patient Education  2019 [employer] Inc[employer]  

## 2018-10-11 NOTE — MAU Provider Note (Addendum)
Chief Complaint: Possible Pregnancy; Abdominal Pain; and Breast Pain   First Provider Initiated Contact with Patient 10/11/18 [phone]        SUBJECTIVE HPI: Monique Keith is a 23 y.o.  at 5684w6d by LMP who presents to maternity admissions reporting lower abdominal cramping and nausea for two days.   Last IC was January 2nd.  Period is late. She denies vaginal bleeding, vaginal itching/burning, urinary symptoms, h/a, dizziness, n/v, or fever/chills.    Recently treated for Gonorrhea and Bacterial vaginosis last week  Possible Pregnancy  This is a new problem. Associated symptoms include abdominal pain, fatigue and nausea. Pertinent negatives include no chills, congestion, headaches, myalgias, urinary symptoms or vomiting. Nothing aggravates the symptoms. She has tried nothing for the symptoms.  Abdominal Pain  This is a new problem. The current episode started in the past 7 days. The onset quality is gradual. The problem has been unchanged. The quality of the pain is aching and dull. The abdominal pain does not radiate. Associated symptoms include nausea. Pertinent negatives include no headaches, myalgias or vomiting. The pain is aggravated by palpation. The pain is relieved by nothing. She has tried nothing for the symptoms.   RN Note: Pt reports lower abdominal cramping and nausea that started 2 days ago. Reports that she feels exhausted all the time. States she missed her period. LMP: mid December. Pt has not taken a pregnancy test.   Past Medical History:  Diagnosis Date  . Hypertension   . Hypoglycemia    Past Surgical History:  Procedure Laterality Date  . HERNIA REPAIR    . UMBILICAL HERNIA REPAIR     Social History   Socioeconomic History  . Marital status: Single    Spouse name: Not on file  . Number of children: Not on file  . Years of education: Not on file  . Highest education level: Not on file  Occupational History  . Not on file  Social Needs  . Financial resource  strain: Not on file  . Food insecurity:    Worry: Not on file    Inability: Not on file  . Transportation needs:    Medical: Not on file    Non-medical: Not on file  Tobacco Use  . Smoking status: Never Smoker  . Smokeless tobacco: Never Used  Substance and Sexual Activity  . Alcohol use: No    Alcohol/week: 0.0 standard drinks  . Drug use: No  . Sexual activity: Yes    Birth control/protection: Condom, Injection  Lifestyle  . Physical activity:    Days per week: Not on file    Minutes per session: Not on file  . Stress: Not on file  Relationships  . Social connections:    Talks on phone: Not on file    Gets together: Not on file    Attends religious service: Not on file    Active member of club or organization: Not on file    Attends meetings of clubs or organizations: Not on file    Relationship status: Not on file  . Intimate partner violence:    Fear of current or ex partner: Not on file    Emotionally abused: Not on file    Physically abused: Not on file    Forced sexual activity: Not on file  Other Topics Concern  . Not on file  Social History Narrative  . Not on file   No current facility-administered medications on file prior to encounter.    Current Outpatient  Medications on File Prior to Encounter  Medication Sig Dispense Refill  . cyclobenzaprine (FLEXERIL) 10 MG tablet Take 1 tablet (10 mg total) by mouth 2 (two) times daily as needed for muscle spasms. 10 tablet 0  . ibuprofen (ADVIL,MOTRIN) 600 MG tablet Take 1 tablet (600 mg total) by mouth every 6 (six) hours as needed. (Patient not taking: Reported on 03/30/2018) 30 tablet 0  . metroNIDAZOLE (FLAGYL) 500 MG tablet Take 1 tablet (500 mg total) by mouth 2 (two) times daily. 14 tablet 0  . ondansetron (ZOFRAN ODT) 4 MG disintegrating tablet Take 1 tablet (4 mg total) by mouth every 8 (eight) hours as needed for nausea or vomiting. (Patient not taking: Reported on 03/30/2018) 20 tablet 0  . predniSONE  (DELTASONE) 20 MG tablet Take 2 tablets (40 mg total) by mouth daily. (Patient not taking: Reported on 03/30/2018) 8 tablet 0   No Known Allergies  I have reviewed patient's Past Medical Hx, Surgical Hx, Family Hx, Social Hx, medications and allergies.   ROS:  Review of Systems  Constitutional: Positive for fatigue. Negative for chills.  HENT: Negative for congestion.   Gastrointestinal: Positive for abdominal pain and nausea. Negative for vomiting.  Musculoskeletal: Negative for myalgias.  Neurological: Negative for headaches.   Review of Systems  Other systems negative   Physical Exam  Physical Exam Patient Vitals for the past 24 hrs:  BP Temp Temp src Pulse Resp SpO2 Height Weight  10/11/18 0651 124/74 98 F (36.7 C) Oral 65 16 100 % - -  10/11/18 3664 - - - - - - 5\' 4"  (1.626 m) 59.4 kg   Constitutional: Well-developed, well-nourished female in no acute distress.  Cardiovascular: normal rate Respiratory: normal effort GI: Abd soft, non-tender. Pos BS x 4 MS: Extremities nontender, no edema, normal ROM Neurologic: Alert and oriented x 4.  GU: Neg CVAT.  PELVIC EXAM: Cultures deferred due to being done a few days ago.  Bimanual exam: Cervix 0/long/high, firm, anterior, neg CMT, uterus tender, nonenlarged, adnexa without tenderness, enlargement, or mass   LAB RESULTS Results for orders placed or performed during the hospital encounter of 10/11/18 (from the past 24 hour(s))  Pregnancy, urine POC     Status: Abnormal   Collection Time: 10/11/18  7:04 AM  Result Value Ref Range   Preg Test, Ur POSITIVE (A) NEGATIVE  Urinalysis, Routine w reflex microscopic     Status: None   Collection Time: 10/11/18  7:06 AM  Result Value Ref Range   Color, Urine YELLOW YELLOW   APPearance CLEAR CLEAR   Specific Gravity, Urine 1.016 1.005 - 1.030   pH 8.0 5.0 - 8.0   Glucose, UA NEGATIVE NEGATIVE mg/dL   Hgb urine dipstick NEGATIVE NEGATIVE   Bilirubin Urine NEGATIVE NEGATIVE    Ketones, ur NEGATIVE NEGATIVE mg/dL   Protein, ur NEGATIVE NEGATIVE mg/dL   Nitrite NEGATIVE NEGATIVE   Leukocytes, UA NEGATIVE NEGATIVE    IMAGING No results found.  MAU Management/MDM: Ordered usual first trimester r/o ectopic labs.   Pelvic exam and cultures done Will check baseline Ultrasound to rule out ectopic.  This pain can represent a normal pregnancy with bleeding, spontaneous abortion or even an ectopic which can be life-threatening. It may be due to gonorrhea infection. The process as listed above helps to determine which of these is present.  Discussed process of workup to rule out ectopic  ASSESSMENT Pregnancy at [redacted]w[redacted]d Pelvic pain Recently treated Gonorrhea and BV Pregnancy of unknown location  PLAN Care turned over to oncoming provider    Wynelle BourgeoisMarie Williams CNM, MSN Certified Nurse-Midwife 10/11/2018  7:11 AM   MDM for Oncoming Provider  Patient Vitals for the past 24 hrs:  BP Temp Temp src Pulse Resp SpO2 Height Weight  10/11/18 0955 125/78 - - 81 18 - - -  10/11/18 0651 124/74 98 F (36.7 C) Oral 65 16 100 % - -  10/11/18 [id number] - - - - - - 5\' 4"  (1.626 m) 59.4 kg    Results for orders placed or performed during the hospital encounter of 10/11/18 (from the past 24 hour(s))  Pregnancy, urine POC     Status: Abnormal   Collection Time: 10/11/18  7:04 AM  Result Value Ref Range   Preg Test, Ur POSITIVE (A) NEGATIVE  Urinalysis, Routine w reflex microscopic     Status: None   Collection Time: 10/11/18  7:06 AM  Result Value Ref Range   Color, Urine YELLOW YELLOW   APPearance CLEAR CLEAR   Specific Gravity, Urine 1.016 1.005 - 1.030   pH 8.0 5.0 - 8.0   Glucose, UA NEGATIVE NEGATIVE mg/dL   Hgb urine dipstick NEGATIVE NEGATIVE   Bilirubin Urine NEGATIVE NEGATIVE   Ketones, ur NEGATIVE NEGATIVE mg/dL   Protein, ur NEGATIVE NEGATIVE mg/dL   Nitrite NEGATIVE NEGATIVE   Leukocytes, UA NEGATIVE NEGATIVE  CBC     Status: None   Collection Time: 10/11/18   8:10 AM  Result Value Ref Range   WBC 6.3 4.0 - 10.5 K/uL   RBC 4.48 3.87 - 5.11 MIL/uL   Hemoglobin 12.6 12.0 - 15.0 g/dL   HCT 11.938.4 14.736.0 - 82.946.0 %   MCV 85.7 80.0 - 100.0 fL   MCH 28.1 26.0 - 34.0 pg   MCHC 32.8 30.0 - 36.0 g/dL   RDW 56.213.5 13.011.5 - 86.515.5 %   Platelets 351 150 - 400 K/uL   nRBC 0.0 0.0 - 0.2 %  hCG, quantitative, pregnancy     Status: Abnormal   Collection Time: 10/11/18  8:10 AM  Result Value Ref Range   hCG, Beta Chain, Quant, S 19,113 (H) <5 mIU/mL  ABO/Rh     Status: None (Preliminary result)   Collection Time: 10/11/18  8:10 AM  Result Value Ref Range   ABO/RH(D)      A POS Performed at [facility], [address] [place], [place] [address]     [place] Ob Less Than 14 Weeks With Ob Transvaginal  Result Date: 10/11/2018 CLINICAL DATA:  Abdominal pain in pregnancy. EXAM: OBSTETRIC <14 WK US AND TRANSVAGINAL OB US TECHNIQUE: Both transabdominal and transvaginal ultrasound examinations were performed for complete evaluation of the gestation as well as the maternal uterus, adnexal regions, and pelvic cul-de-sac. Transvaginal technique was performed to assess early pregnancy. COMPARISON:  06/26/2016 FINDINGS: Intrauterine gestational sac: Single Yolk sac:  Visualized. Embryo:  Not Visualized. Cardiac Activity: Not Visualized. MSD: 13.2.  mm   6 w   1 d Subchorionic hemorrhage:  None visualized. Maternal uterus/adnexae: Subchorionic hemorrhage: None Right ovary: Normal Left ovary: Normal Other :Within the left adnexa there is a tubular, anechoic cystic structure which appears separate from the left ovary measuring 3.5 x 1.0 x 1.1 cm. Free fluid:  Trace IMPRESSION: Probable early intrauterine gestational sac which contains a yolk sac, but there is no fetal pole, or cardiac activity yet visualized. Recommend follow-up quantitative B-HCG levels and follow-up US in 14 days to confirm and assess viability. This recommendation follows SRU  consensus guidelines: Diagnostic Criteria for  Nonviable Pregnancy Early in the First Trimester. Malva Limes Med 2013; 161:0960-45. There is a tubular, anechoic structures in the left adnexa separate from the left ovary. Suspicious for hydrosalpinx. Electronically Signed   By: Signa Kell M.D.   On: 10/11/2018 09:23    A/P: --23 y.o. G1P0 with  IUP at [redacted]w[redacted]d by US performed today --Rx prenatal vitamins --Discharge home in stable condition with first trimester/bleeding precautions  F/U: Patient to return to MAU Sunday morning 10/13/2018 for repeat Quant hCG  Clayton Bibles, CNM 10/11/18  9:59 AM

## 2018-10-11 NOTE — MAU Note (Signed)
Pt reports lower abdominal cramping and nausea that started 2 days ago. Reports that she feels exhausted all the time. States she missed her period. LMP: mid December. Pt has not taken a pregnancy test.

## 2018-10-13 ENCOUNTER — Inpatient Hospital Stay (HOSPITAL_COMMUNITY)
Admission: AD | Admit: 2018-10-13 | Discharge: 2018-10-13 | Disposition: A | Discharge status: Home / Self Care | Payer: BLUE CROSS/BLUE SHIELD | Attending: Obstetrics and Gynecology | Admitting: Obstetrics and Gynecology

## 2018-10-13 DIAGNOSIS — R102 Pelvic and perineal pain: Secondary | ICD-10-CM | POA: Insufficient documentation

## 2018-10-13 DIAGNOSIS — Z3A01 Less than 8 weeks gestation of pregnancy: Secondary | ICD-10-CM

## 2018-10-13 DIAGNOSIS — O99331 Smoking (tobacco) complicating pregnancy, first trimester: Secondary | ICD-10-CM | POA: Insufficient documentation

## 2018-10-13 DIAGNOSIS — F17210 Nicotine dependence, cigarettes, uncomplicated: Secondary | ICD-10-CM | POA: Insufficient documentation

## 2018-10-13 DIAGNOSIS — Z3490 Encounter for supervision of normal pregnancy, unspecified, unspecified trimester: Secondary | ICD-10-CM

## 2018-10-13 DIAGNOSIS — O26891 Other specified pregnancy related conditions, first trimester: Secondary | ICD-10-CM | POA: Diagnosis not present

## 2018-10-13 LAB — HCG, QUANTITATIVE, PREGNANCY: hCG, Beta Chain, Quant, S: 32228 m[IU]/mL — ABNORMAL HIGH (ref <5)

## 2018-10-13 MED ORDER — PROMETHAZINE HCL 25 MG PO TABS: 12.5000 mg | ORAL_TABLET | Freq: Four times a day (QID) | ORAL | 1 refills | Status: DC | PRN

## 2018-10-13 NOTE — MAU Provider Note (Signed)
History     CSN: [id number]  Arrival date and time: 10/13/18 1317   None     Chief Complaint  Patient presents with  . Follow-up   Monique Keith is a 23 y.o. G1P0 at 5094w3d who presents today for FU HCG. She was seen on 10/11/18 and had US that showed yolk sac, but was advised to return today for repeat HCG. She reports that she is still having mild, intermittent cramping.   Pelvic Pain  The patient's primary symptoms include pelvic pain. The patient's pertinent negatives include no vaginal discharge. This is a new problem. The current episode started in the past 7 days. The problem occurs intermittently. The problem has been unchanged. The pain is mild. The problem affects both sides. She is pregnant. Pertinent negatives include no chills, dysuria, fever, frequency, nausea or vomiting. The vaginal discharge was normal. There has been no bleeding. Nothing aggravates the symptoms. She has tried nothing for the symptoms. Menstrual history: LMP 09/14/18     OB History    Gravida  1   Para      Term      Preterm      AB      Living        SAB      TAB      Ectopic      Multiple      Live Births              Past Medical History:  Diagnosis Date  . Chlamydia   . Gonorrhea   . Hypertension   . Hypoglycemia     Past Surgical History:  Procedure Laterality Date  . HERNIA REPAIR    . UMBILICAL HERNIA REPAIR      Family History  Problem Relation Age of Onset  . Hypertension Other   . Diabetes Other   . Hyperlipidemia Other     Social History   Tobacco Use  . Smoking status: Current Some Day Smoker    Packs/day: 0.25  . Smokeless tobacco: Never Used  Substance Use Topics  . Alcohol use: No    Alcohol/week: 0.0 standard drinks  . Drug use: Yes    Types: Marijuana    Comment: Last used 10/10/18    Allergies: No Known Allergies  Medications Prior to Admission  Medication Sig Dispense Refill Last Dose  . cyclobenzaprine (FLEXERIL) 10 MG tablet Take  1 tablet (10 mg total) by mouth 2 (two) times daily as needed for muscle spasms. 10 tablet 0   . ibuprofen (ADVIL,MOTRIN) 600 MG tablet Take 1 tablet (600 mg total) by mouth every 6 (six) hours as needed. (Patient not taking: Reported on 03/30/2018) 30 tablet 0 Completed Course at Unknown time  . metroNIDAZOLE (FLAGYL) 500 MG tablet Take 1 tablet (500 mg total) by mouth 2 (two) times daily. 14 tablet 0   . ondansetron (ZOFRAN ODT) 4 MG disintegrating tablet Take 1 tablet (4 mg total) by mouth every 8 (eight) hours as needed for nausea or vomiting. (Patient not taking: Reported on 03/30/2018) 20 tablet 0 Completed Course at Unknown time  . predniSONE (DELTASONE) 20 MG tablet Take 2 tablets (40 mg total) by mouth daily. (Patient not taking: Reported on 03/30/2018) 8 tablet 0 Completed Course at Unknown time  . Prenatal Vit-Fe Fumarate-FA (PREPLUS) 27-1 MG TABS Take 1 tablet by mouth daily. 30 tablet 13     Review of Systems  Constitutional: Negative for chills and fever.  Gastrointestinal: Negative for nausea  and vomiting.  Genitourinary: Positive for pelvic pain. Negative for dysuria, frequency, vaginal bleeding and vaginal discharge.   Physical Exam   Blood pressure 140/90, pulse 69, temperature 97.8 F (36.6 C), temperature source Oral, resp. rate 18, height 5\' 4"  (1.626 m), weight 57.6 kg, last menstrual period 09/14/2018.  Physical Exam  Nursing note and vitals reviewed. Constitutional: She is oriented to person, place, and time. She appears well-developed and well-nourished. No distress.  HENT:  Head: Normocephalic.  Cardiovascular: Normal rate.  Respiratory: Effort normal.  GI: Soft. There is no abdominal tenderness. There is no rebound.  Neurological: She is alert and oriented to person, place, and time.  Skin: Skin is warm and dry.   Results for Monique Keith (MRN [id number]) as of 10/13/2018 15:10  Ref. Range 10/11/2018 08:10 10/11/2018 09:09 10/13/2018 13:44  HCG, Beta Chain, Quant,  S Latest Ref Range: <5 mIU/mL 19,113 (H)  32,228 (H)   MAU Course  Procedures  MDM  >60% increase in HCG in 48 hours, and yolk sac seen on [place] 2 days ago.   Assessment and Plan   1. Intrauterine pregnancy   2. [redacted] weeks gestation of pregnancy    DC home Comfort measures reviewed  1st Trimester precautions  Bleeding precautions Ectopic precautions RX: phenergan PRN #30 with 1RF sent to pharmacy  Return to MAU as needed FU with OB as planned FU [place] ordered for 2 weeks from now  Follow-up Information    [facility].   Specialty:  Obstetrics and Gynecology Contact information: [address] [place] [address] [phone]          Thressa Sheller DNP, CNM  10/13/18  3:11 PM

## 2018-10-13 NOTE — Discharge Instructions (Signed)
First Trimester of Pregnancy  The first trimester of pregnancy is from week 1 until the end of week 13 (months 1 through 3). A week after a sperm fertilizes an egg, the egg will implant on the wall of the uterus. This embryo will begin to develop into a baby. Genes from you and your partner will form the baby. The female genes will determine whether the baby will be a boy or a girl. At 6-8 weeks, the eyes and face will be formed, and the heartbeat can be seen on ultrasound. At the end of 12 weeks, all the baby's organs will be formed.  Now that you are pregnant, you will want to do everything you can to have a healthy baby. Two of the most important things are to get good prenatal care and to follow your health care provider's instructions. Prenatal care is all the medical care you receive before the baby's birth. This care will help prevent, find, and treat any problems during the pregnancy and childbirth.  Body changes during your first trimester  Your body goes through many changes during pregnancy. The changes vary from woman to woman.   You may gain or lose a couple of pounds at first.   You may feel sick to your stomach (nauseous) and you may throw up (vomit). If the vomiting is uncontrollable, call your health care provider.   You may tire easily.   You may develop headaches that can be relieved by medicines. All medicines should be approved by your health care provider.   You may urinate more often. Painful urination may mean you have a bladder infection.   You may develop heartburn as a result of your pregnancy.   You may develop constipation because certain hormones are causing the muscles that push stool through your intestines to slow down.   You may develop hemorrhoids or swollen veins (varicose veins).   Your breasts may begin to grow larger and become tender. Your nipples may stick out more, and the tissue that surrounds them (areola) may become darker.   Your gums may bleed and may be  sensitive to brushing and flossing.   Dark spots or blotches (chloasma, mask of pregnancy) may develop on your face. This will likely fade after the baby is born.   Your menstrual periods will stop.   You may have a loss of appetite.   You may develop cravings for certain kinds of food.   You may have changes in your emotions from day to day, such as being excited to be pregnant or being concerned that something may go wrong with the pregnancy and baby.   You may have more vivid and strange dreams.   You may have changes in your hair. These can include thickening of your hair, rapid growth, and changes in texture. Some women also have hair loss during or after pregnancy, or hair that feels dry or thin. Your hair will most likely return to normal after your baby is born.  What to expect at prenatal visits  During a routine prenatal visit:   You will be weighed to make sure you and the baby are growing normally.   Your blood pressure will be taken.   Your abdomen will be measured to track your baby's growth.   The fetal heartbeat will be listened to between weeks 10 and 14 of your pregnancy.   Test results from any previous visits will be discussed.  Your health care provider may ask you:     How you are feeling.   If you are feeling the baby move.   If you have had any abnormal symptoms, such as leaking fluid, bleeding, severe headaches, or abdominal cramping.   If you are using any tobacco products, including cigarettes, chewing tobacco, and electronic cigarettes.   If you have any questions.  Other tests that may be performed during your first trimester include:   Blood tests to find your blood type and to check for the presence of any previous infections. The tests will also be used to check for low iron levels (anemia) and protein on red blood cells (Rh antibodies). Depending on your risk factors, or if you previously had diabetes during pregnancy, you may have tests to check for high blood sugar  that affects pregnant women (gestational diabetes).   Urine tests to check for infections, diabetes, or protein in the urine.   An ultrasound to confirm the proper growth and development of the baby.   Fetal screens for spinal cord problems (spina bifida) and Down syndrome.   HIV (human immunodeficiency virus) testing. Routine prenatal testing includes screening for HIV, unless you choose not to have this test.   You may need other tests to make sure you and the baby are doing well.  Follow these instructions at home:  Medicines   Follow your health care provider's instructions regarding medicine use. Specific medicines may be either safe or unsafe to take during pregnancy.   Take a prenatal vitamin that contains at least 600 micrograms (mcg) of folic acid.   If you develop constipation, try taking a stool softener if your health care provider approves.  Eating and drinking     Eat a balanced diet that includes fresh fruits and vegetables, whole grains, good sources of protein such as meat, eggs, or tofu, and low-fat dairy. Your health care provider will help you determine the amount of weight gain that is right for you.   Avoid raw meat and uncooked cheese. These carry germs that can cause birth defects in the baby.   Eating four or five small meals rather than three large meals a day may help relieve nausea and vomiting. If you start to feel nauseous, eating a few soda crackers can be helpful. Drinking liquids between meals, instead of during meals, also seems to help ease nausea and vomiting.   Limit foods that are high in fat and processed sugars, such as fried and sweet foods.   To prevent constipation:  ? Eat foods that are high in fiber, such as fresh fruits and vegetables, whole grains, and beans.  ? Drink enough fluid to keep your urine clear or pale yellow.  Activity   Exercise only as directed by your health care provider. Most women can continue their usual exercise routine during  pregnancy. Try to exercise for 30 minutes at least 5 days a week. Exercising will help you:  ? Control your weight.  ? Stay in shape.  ? Be prepared for labor and delivery.   Experiencing pain or cramping in the lower abdomen or lower back is a good sign that you should stop exercising. Check with your health care provider before continuing with normal exercises.   Try to avoid standing for long periods of time. Move your legs often if you must stand in one place for a long time.   Avoid heavy lifting.   Wear low-heeled shoes and practice good posture.   You may continue to have sex unless your health care   provider tells you not to.  Relieving pain and discomfort   Wear a good support bra to relieve breast tenderness.   Take warm sitz baths to soothe any pain or discomfort caused by hemorrhoids. Use hemorrhoid cream if your health care provider approves.   Rest with your legs elevated if you have leg cramps or low back pain.   If you develop varicose veins in your legs, wear support hose. Elevate your feet for 15 minutes, 3-4 times a day. Limit salt in your diet.  Prenatal care   Schedule your prenatal visits by the twelfth week of pregnancy. They are usually scheduled monthly at first, then more often in the last 2 months before delivery.   Write down your questions. Take them to your prenatal visits.   Keep all your prenatal visits as told by your health care provider. This is important.  Safety   Wear your seat belt at all times when driving.   Make a list of emergency phone numbers, including numbers for family, friends, the hospital, and police and fire departments.  General instructions   Ask your health care provider for a referral to a local prenatal education class. Begin classes no later than the beginning of month 6 of your pregnancy.   Ask for help if you have counseling or nutritional needs during pregnancy. Your health care provider can offer advice or refer you to specialists for help  with various needs.   Do not use hot tubs, steam rooms, or saunas.   Do not douche or use tampons or scented sanitary pads.   Do not cross your legs for long periods of time.   Avoid cat litter boxes and soil used by cats. These carry germs that can cause birth defects in the baby and possibly loss of the fetus by miscarriage or stillbirth.   Avoid all smoking, herbs, alcohol, and medicines not prescribed by your health care provider. Chemicals in these products affect the formation and growth of the baby.   Do not use any products that contain nicotine or tobacco, such as cigarettes and e-cigarettes. If you need help quitting, ask your health care provider. You may receive counseling support and other resources to help you quit.   Schedule a dentist appointment. At home, brush your teeth with a soft toothbrush and be gentle when you floss.  Contact a health care provider if:   You have dizziness.   You have mild pelvic cramps, pelvic pressure, or nagging pain in the abdominal area.   You have persistent nausea, vomiting, or diarrhea.   You have a bad smelling vaginal discharge.   You have pain when you urinate.   You notice increased swelling in your face, hands, legs, or ankles.   You are exposed to fifth disease or chickenpox.   You are exposed to German measles (rubella) and have never had it.  Get help right away if:   You have a fever.   You are leaking fluid from your vagina.   You have spotting or bleeding from your vagina.   You have severe abdominal cramping or pain.   You have rapid weight gain or loss.   You vomit blood or material that looks like coffee grounds.   You develop a severe headache.   You have shortness of breath.   You have any kind of trauma, such as from a fall or a car accident.  Summary   The first trimester of pregnancy is from week 1 until   the end of week 13 (months 1 through 3).   Your body goes through many changes during pregnancy. The changes vary from  woman to woman.   You will have routine prenatal visits. During those visits, your health care provider will examine you, discuss any test results you may have, and talk with you about how you are feeling.  This information is not intended to replace advice given to you by your health care provider. Make sure you discuss any questions you have with your health care provider.  Document Released: 08/29/2001 Document Revised: 08/16/2016 Document Reviewed: 08/16/2016  Elsevier Interactive Patient Education  2019 [employer]

## 2018-10-13 NOTE — MAU Note (Signed)
Monique Keith is a 23 y.o. at [redacted]w[redacted]d here in MAU for follow up HCG. No bleeding, had some pain earlier but none at this time.  Pain score: 0 Vitals:   10/13/18 1331  BP: 140/90  Pulse: 69  Resp: 18  Temp: 97.8 F (36.6 C)      Lab orders placed from triage: none

## 2018-10-15 ENCOUNTER — Inpatient Hospital Stay (HOSPITAL_COMMUNITY)
Admission: AD | Admit: 2018-10-15 | Discharge: 2018-10-15 | Disposition: A | Discharge status: Home / Self Care | Payer: BLUE CROSS/BLUE SHIELD | Attending: Obstetrics and Gynecology | Admitting: Obstetrics and Gynecology

## 2018-10-15 ENCOUNTER — Encounter (HOSPITAL_COMMUNITY): Payer: Self-pay | Admitting: *Deleted

## 2018-10-15 DIAGNOSIS — O219 Vomiting of pregnancy, unspecified: Secondary | ICD-10-CM

## 2018-10-15 DIAGNOSIS — Z87891 Personal history of nicotine dependence: Secondary | ICD-10-CM | POA: Insufficient documentation

## 2018-10-15 DIAGNOSIS — O210 Mild hyperemesis gravidarum: Secondary | ICD-10-CM | POA: Diagnosis present

## 2018-10-15 DIAGNOSIS — Z3A01 Less than 8 weeks gestation of pregnancy: Secondary | ICD-10-CM

## 2018-10-15 LAB — URINALYSIS, ROUTINE W REFLEX MICROSCOPIC
BILIRUBIN URINE: NEGATIVE
Bacteria, UA: NONE SEEN
Glucose, UA: NEGATIVE mg/dL
Hgb urine dipstick: NEGATIVE
Ketones, ur: 80 mg/dL — AB
Nitrite: NEGATIVE
PROTEIN: 30 mg/dL — AB
Specific Gravity, Urine: 1.032 — ABNORMAL HIGH (ref 1.005–1.030)
pH: 5 (ref 5.0–8.0)

## 2018-10-15 MED ORDER — M.V.I. ADULT IV INJ
Freq: Once | INTRAVENOUS | Status: AC
  Administered 2018-10-15: 11:00:00 via INTRAVENOUS
  Filled 2018-10-15: qty 10

## 2018-10-15 NOTE — MAU Note (Addendum)
Reports increased nausea in past two days.  Has rx for Phenergan, last took last night, but it isn't helping.  Able to keep down some liquids, but no food. Emesis x2 in 24hrs.  No pain or discharge today.

## 2018-10-15 NOTE — Discharge Instructions (Signed)
What are the causes? The cause of this condition is not known. It may be related to changes in chemicals (hormones) in the body during pregnancy, such as the high level of pregnancy hormone (human chorionic gonadotropin) or the increase in the female sex hormone (estrogen). What are the signs or symptoms? Symptoms of this condition include:  Nausea that does not go away.  Vomiting that does not allow you to keep any food down.  Weight loss.  Body fluid loss (dehydration).  Having no desire to eat, or not liking food that you have previously enjoyed. How is this diagnosed? This condition may be diagnosed based on:  A physical exam.  Your medical history.  Your symptoms.  Blood tests.  Urine tests. How is this treated? This condition is managed by controlling symptoms. This may include:  Following an eating plan. This can help lessen nausea and vomiting.  Taking prescription medicines. An eating plan and medicines are often used together to help control symptoms. If medicines do not help relieve nausea and vomiting, you may need to receive fluids through an IV at the hospital. Follow these instructions at home: Eating and drinking   Avoid the following: ? Drinking fluids with meals. Try not to drink anything during the 30 minutes before and after your meals. ? Drinking more than 1 cup of fluid at a time. ? Eating foods that trigger your symptoms. These may include spicy foods, coffee, high-fat foods, very sweet foods, and acidic foods. ? Skipping meals. Nausea can be more intense on an empty stomach. If you cannot tolerate food, do not force it. Try sucking on ice chips or other frozen items and make up for missed calories later. ? Lying down within 2 hours after eating. ? Being exposed to environmental triggers. These may include food smells, smoky rooms, closed spaces, rooms with strong smells, warm or humid places, overly loud and noisy rooms, and rooms with motion or  flickering lights. Try eating meals in a well-ventilated area that is free of strong smells. ? Quick and sudden changes in your movement. ? Taking iron pills and multivitamins that contain iron. If you take prescription iron pills, do not stop taking them unless your health care provider approves. ? Preparing food. The smell of food can spoil your appetite or trigger nausea.  To help relieve your symptoms: ? Listen to your body. Everyone is different and has different preferences. Find what works best for you. ? Eat and drink slowly. ? Eat 5-6 small meals daily instead of 3 large meals. Eating small meals and snacks can help you avoid an empty stomach. ? In the morning, before getting out of bed, eat a couple of crackers to avoid moving around on an empty stomach. ? Try eating starchy foods as these are usually tolerated well. Examples include cereal, toast, bread, potatoes, pasta, rice, and pretzels. ? Include at least 1 serving of protein with your meals and snacks. Protein options include lean meats, poultry, seafood, beans, nuts, nut butters, eggs, cheese, and yogurt. ? Try eating a protein-rich snack before bed. Examples of a protein-rick snack include cheese and crackers or a peanut butter sandwich made with 1 slice of whole-wheat bread and 1 tsp (5 g) of peanut butter. ? Eat or suck on things that have ginger in them. It may help relieve nausea. Add  tsp ground ginger to hot tea or choose ginger tea. ? Try drinking 100% fruit juice or an electrolyte drink. An electrolyte drink contains sodium,  potassium, and chloride. ? Drink fluids that are cold, clear, and carbonated or sour. Examples include lemonade, ginger ale, lemon-lime soda, ice water, and sparkling water. ? Brush your teeth or use a mouth rinse after meals. ? Talk with your health care provider about starting a supplement of vitamin B6. General instructions  Take over-the-counter and prescription medicines only as told by your  health care provider.  Follow instructions from your health care provider about eating or drinking restrictions.  Continue to take your prenatal vitamins as told by your health care provider. If you are having trouble taking your prenatal vitamins, talk with your health care provider about different options.  Keep all follow-up and pre-birth (prenatal) visits as told by your health care provider. This is important. Contact a health care provider if:  You have pain in your abdomen.  You have a severe headache.  You have vision problems.  You are losing weight.  You feel weak or dizzy. Get help right away if:  You cannot drink fluids without vomiting.  You vomit blood.  You have constant nausea and vomiting.  You are very weak.  You faint.  You have a fever and your symptoms suddenly get worse. Summary  Hyperemesis gravidarum is a severe form of nausea and vomiting that happens during pregnancy.  Making some changes to your eating habits may help relieve nausea and vomiting.  This condition may be managed with medicine.  If medicines do not help relieve nausea and vomiting, you may need to receive fluids through an IV at the hospital. This information is not intended to replace advice given to you by your health care provider. Make sure you discuss any questions you have with your health care provider. Document Released: 09/04/2005 Document Revised: 09/24/2017 Document Reviewed: 05/03/2016 Elsevier Interactive Patient Education  2019 [employer].

## 2018-10-15 NOTE — MAU Provider Note (Signed)
History     CSN: [id number]  Arrival date and time: 10/15/18 1683   First Provider Initiated Contact with Patient 10/15/18 [phone]      Chief Complaint  Patient presents with  . Nausea   HPI Monique Keith is a 23 y.o. G1P0 at [redacted]w[redacted]d who presents to MAU with chief complaint of nausea and vomiting.this is a recurring problem. Patient has prescriptions for Zofran and Phenergan but states she "still can't eat like I used to". She states she had to leave work this morning due to severe nausea. She has been able to tolerate fruit and yogurt as well as fruit juices. She denies vaginal bleeding, abnormal vaginal discharge, fever, falls, or recent illness.    OB History    Gravida  1   Para      Term      Preterm      AB      Living        SAB      TAB      Ectopic      Multiple      Live Births              Past Medical History:  Diagnosis Date  . Chlamydia   . Gonorrhea   . Hypertension   . Hypoglycemia     Past Surgical History:  Procedure Laterality Date  . HERNIA REPAIR    . UMBILICAL HERNIA REPAIR      Family History  Problem Relation Age of Onset  . Hypertension Other   . Diabetes Other   . Hyperlipidemia Other     Social History   Tobacco Use  . Smoking status: Former Smoker    Packs/day: 0.25    Last attempt to quit: 10/08/2018    Years since quitting: 0.0  . Smokeless tobacco: Never Used  Substance Use Topics  . Alcohol use: No    Alcohol/week: 0.0 standard drinks  . Drug use: Yes    Types: Marijuana    Comment: Last used 10/10/18    Allergies: No Known Allergies  No medications prior to admission.    Review of Systems  Gastrointestinal: Positive for nausea and vomiting. Negative for abdominal pain.  Genitourinary: Negative for vaginal bleeding, vaginal discharge and vaginal pain.  Musculoskeletal: Negative for back pain.  All other systems reviewed and are negative.  Physical Exam   Blood pressure 132/79, pulse 61, temperature  98 F (36.7 C), temperature source Axillary, resp. rate 16, weight 57 kg, last menstrual period 09/14/2018, SpO2 100 %.  Physical Exam  Nursing note and vitals reviewed. Constitutional: She is oriented to person, place, and time. She appears well-developed and well-nourished.  Cardiovascular: Normal rate.  Respiratory: Effort normal.  GI: Soft. Bowel sounds are normal. She exhibits no distension. There is no abdominal tenderness. There is no rebound, no guarding and no CVA tenderness.  Neurological: She is alert and oriented to person, place, and time.  Skin: Skin is warm and dry.  Psychiatric: She has a normal mood and affect. Her behavior is normal. Judgment and thought content normal.    MAU Course/MDM   --20 minutes at bedside discussing diet modification to avoid triggering episodes of nausea. --Explained to patient that our expectations for management will not necessarily allow her to eat as she did before pregnancy  Patient Vitals for the past 24 hrs:  BP Temp Temp src Pulse Resp SpO2 Weight  10/15/18 1201 132/79 - - 61 16 - -  10/15/18  4920 124/61 98 F (36.7 C) Axillary 64 16 100 % -  10/15/18 0945 - - - - - - 57 kg    Results for orders placed or performed during the hospital encounter of 10/15/18 (from the past 24 hour(s))  Urinalysis, Routine w reflex microscopic     Status: Abnormal   Collection Time: 10/15/18  9:48 AM  Result Value Ref Range   Color, Urine AMBER (A) YELLOW   APPearance HAZY (A) CLEAR   Specific Gravity, Urine 1.032 (H) 1.005 - 1.030   pH 5.0 5.0 - 8.0   Glucose, UA NEGATIVE NEGATIVE mg/dL   Hgb urine dipstick NEGATIVE NEGATIVE   Bilirubin Urine NEGATIVE NEGATIVE   Ketones, ur 80 (A) NEGATIVE mg/dL   Protein, ur 30 (A) NEGATIVE mg/dL   Nitrite NEGATIVE NEGATIVE   Leukocytes, UA SMALL (A) NEGATIVE   RBC / HPF 0-5 0 - 5 RBC/hpf   WBC, UA 11-20 0 - 5 WBC/hpf   Bacteria, UA NONE SEEN NONE SEEN   Squamous Epithelial / LPF 11-20 0 - 5   Mucus  PRESENT     . Meds ordered this encounter  Medications  . multivitamins adult (INFUVITE ADULT) 10 mL in dextrose 5% lactated ringers 1,000 mL infusion   Assessment and Plan  --23 y.o. G1P0 at [redacted]w[redacted]d  --Nausea and vomiting, managed with medications but poor diet modification --S/P IV multivitamin bag --Tolerating PO at time of discharge --Discharge home in stable condition  Calvert Cantor, CNM 10/15/2018, 12:13 PM

## 2018-10-18 ENCOUNTER — Inpatient Hospital Stay (HOSPITAL_COMMUNITY)
Admission: AD | Admit: 2018-10-18 | Discharge: 2018-10-18 | Disposition: A | Discharge status: Home / Self Care | Payer: BLUE CROSS/BLUE SHIELD | Attending: Obstetrics and Gynecology | Admitting: Obstetrics and Gynecology

## 2018-10-18 NOTE — MAU Provider Note (Signed)
Chief Complaint: No chief complaint on file.   First Provider Initiated Contact with Patient 10/18/18 1311      SUBJECTIVE HPI: [name] is a 23 y.o. G1P0 who presents to maternity admissions for a change in Rx. She reports she is taking Phenergan, but "it does not work, makes me dizzy and I can't work while taking that." She wants to be Rx'd ParamedicDiclegis, because one of her friends took it and it worked for her. She denies feeling dehydrated, because she is able to tolerate solids and liquids. She really wants something to keep the nausea down." She is a patient of [facility], she called the RN and was told the RN would call her back. When she called to inquire about what time the RN would call her back, she asked if she could just come to the ER for new Rx. She was advised that the RN would call her back later. She then decided to come to MAU, because she has to go to work and cannot wait any longer.  Past Medical History:  Diagnosis Date  . Chlamydia   . Gonorrhea   . Hypertension   . Hypoglycemia    Past Surgical History:  Procedure Laterality Date  . HERNIA REPAIR    . UMBILICAL HERNIA REPAIR     Social History   Socioeconomic History  . Marital status: Single    Spouse name: Not on file  . Number of children: Not on file  . Years of education: Not on file  . Highest education level: Not on file  Occupational History  . Not on file  Social Needs  . Financial resource strain: Not on file  . Food insecurity:    Worry: Not on file    Inability: Not on file  . Transportation needs:    Medical: Not on file    Non-medical: Not on file  Tobacco Use  . Smoking status: Former Smoker    Packs/day: 0.25    Last attempt to quit: 10/08/2018    Years since quitting: 0.0  . Smokeless tobacco: Never Used  Substance and Sexual Activity  . Alcohol use: No    Alcohol/week: 0.0 standard drinks  . Drug use: Yes    Types: Marijuana    Comment: Last used 10/10/18  . Sexual activity: Yes     Birth control/protection: None  Lifestyle  . Physical activity:    Days per week: Not on file    Minutes per session: Not on file  . Stress: Not on file  Relationships  . Social connections:    Talks on phone: Not on file    Gets together: Not on file    Attends religious service: Not on file    Active member of club or organization: Not on file    Attends meetings of clubs or organizations: Not on file    Relationship status: Not on file  . Intimate partner violence:    Fear of current or ex partner: Not on file    Emotionally abused: Not on file    Physically abused: Not on file    Forced sexual activity: Not on file  Other Topics Concern  . Not on file  Social History Narrative  . Not on file   No current facility-administered medications on file prior to encounter.    Current Outpatient Medications on File Prior to Encounter  Medication Sig Dispense Refill  . cyclobenzaprine (FLEXERIL) 10 MG tablet Take 1 tablet (10 mg total) by  mouth 2 (two) times daily as needed for muscle spasms. 10 tablet 0  . metroNIDAZOLE (FLAGYL) 500 MG tablet Take 1 tablet (500 mg total) by mouth 2 (two) times daily. 14 tablet 0  . ondansetron (ZOFRAN ODT) 4 MG disintegrating tablet Take 1 tablet (4 mg total) by mouth every 8 (eight) hours as needed for nausea or vomiting. (Patient not taking: Reported on 03/30/2018) 20 tablet 0  . Prenatal Vit-Fe Fumarate-FA (PREPLUS) 27-1 MG TABS Take 1 tablet by mouth daily. 30 tablet 13  . promethazine (PHENERGAN) 25 MG tablet Take 0.5-1 tablets (12.5-25 mg total) by mouth every 6 (six) hours as needed. 30 tablet 1   No Known Allergies  ROS:  Review of Systems  Constitutional: Negative.   HENT: Negative.   Eyes: Negative.   Respiratory: Negative.   Cardiovascular: Negative.   Gastrointestinal: Positive for nausea and vomiting.  Endocrine: Negative.   Genitourinary: Negative.   Musculoskeletal: Negative.   Skin: Negative.   Allergic/Immunologic:  Negative.   Neurological: Negative.   Hematological: Negative.   Psychiatric/Behavioral: Negative.     I have reviewed patient's Past Medical Hx, Surgical Hx, Family Hx, Social Hx, medications and allergies.   Physical Exam   Patient Vitals for the past 24 hrs:  Weight  10/18/18 1308 55.6 kg   Physical Exam  Constitutional: She is oriented to person, place, and time. She appears well-developed and well-nourished.  HENT:  Head: Normocephalic and atraumatic.  Eyes: Pupils are equal, round, and reactive to light.  Neck: Normal range of motion.  Cardiovascular: Normal rate.  Respiratory: Effort normal.  Genitourinary:    Genitourinary Comments: Pelvic deferred   Musculoskeletal: Normal range of motion.  Neurological: She is alert and oriented to person, place, and time.  Skin: Skin is warm and dry.  Psychiatric: She has a normal mood and affect. Her behavior is normal. Judgment and thought content normal.    MDM Patient denies any concerning symptoms in need of emergent evaluation. Patient advised that she may wait for a room in MAU or may choose to be discharged to seek buying some alternative treatments for nausea. Advised that Diclegis cannot be Rx'd from MAU d/t the need to have a prior authorization before insurance will consider paying for the medication. Offered patient to wait to be seen by a provider, she declined. Advised her to go buy Unisom and Vitamin B6, ginger hard candies, peppermint tea, Preggie Pops, and Zantac 150 mg BID (to reduce acid that can cause nausea). Advised to call [facility] back.  ASSESSMENT MSE Complete Morning Sickness  PLAN Discharge patient at her request to seek buying some alternative treatments for nausea.  Raelyn MoraDawson, Tacha Manni, CNM 10/18/2018 1:24 PM

## 2018-10-18 NOTE — Progress Notes (Signed)
Patient states she did not want to sign in to be seen, but was just requesting to change her nausea medication from Promethazine to Diclegis.  Reports she is able to tolerate PO and does not feel dehydrated.  Urine sample given in case provider requested it.  Pt waiting in lobby for provider to discuss medication options.

## 2018-10-27 ENCOUNTER — Encounter (HOSPITAL_COMMUNITY): Payer: Self-pay | Admitting: *Deleted

## 2018-10-27 ENCOUNTER — Inpatient Hospital Stay (HOSPITAL_COMMUNITY)
Admission: AD | Admit: 2018-10-27 | Discharge: 2018-10-28 | Disposition: A | Discharge status: Home / Self Care | Payer: BLUE CROSS/BLUE SHIELD | Source: Ambulatory Visit | Attending: Obstetrics and Gynecology | Admitting: Obstetrics and Gynecology

## 2018-10-27 DIAGNOSIS — O219 Vomiting of pregnancy, unspecified: Secondary | ICD-10-CM

## 2018-10-27 DIAGNOSIS — O210 Mild hyperemesis gravidarum: Secondary | ICD-10-CM | POA: Diagnosis present

## 2018-10-27 DIAGNOSIS — Z87891 Personal history of nicotine dependence: Secondary | ICD-10-CM | POA: Insufficient documentation

## 2018-10-27 DIAGNOSIS — Z3A01 Less than 8 weeks gestation of pregnancy: Secondary | ICD-10-CM | POA: Diagnosis not present

## 2018-10-27 DIAGNOSIS — Z3A08 8 weeks gestation of pregnancy: Secondary | ICD-10-CM | POA: Insufficient documentation

## 2018-10-27 LAB — URINALYSIS, ROUTINE W REFLEX MICROSCOPIC
Bilirubin Urine: NEGATIVE
GLUCOSE, UA: NEGATIVE mg/dL
HGB URINE DIPSTICK: NEGATIVE
LEUKOCYTES UA: NEGATIVE
Nitrite: NEGATIVE
Protein, ur: 30 mg/dL — AB
Specific Gravity, Urine: >1.03 — ABNORMAL HIGH (ref 1.005–1.030)
pH: 6 (ref 5.0–8.0)

## 2018-10-27 LAB — URINALYSIS, MICROSCOPIC (REFLEX)

## 2018-10-27 MED ORDER — THIAMINE HCL 100 MG/ML IJ SOLN
Freq: Once | INTRAVENOUS | Status: AC
  Administered 2018-10-28: 01:00:00 via INTRAVENOUS
  Filled 2018-10-27: qty 1000

## 2018-10-27 MED ORDER — METOCLOPRAMIDE HCL 5 MG/ML IJ SOLN
5.0000 mg | Freq: Once | INTRAMUSCULAR | Status: AC
  Administered 2018-10-28: 5 mg via INTRAVENOUS
  Filled 2018-10-27: qty 2

## 2018-10-27 NOTE — MAU Provider Note (Signed)
History     CSN: [id number]  Arrival date and time: 10/27/18 2259   First Provider Initiated Contact with Patient 10/27/18 2347      Chief Complaint  Patient presents with  . Nausea  . Emesis   Monique Keith is a [age] y.o. G1P0 at 7655w6d who presents for Nausea and Emesis.  She reports that she started having nausea and emesis around 3am and has had 4-5 incidents today.  She denies diarrhea and problems with urination.   She denies vaginal concerns including discharge, itching, burning, and bleeding.  She reports attempting to drink water and had a couple bites of salad and "a grape" with emesis shortly after. She reports having a script for promethazine, but feels it doesn't work.  She reports taking it around 3pm with an episode of emesis, but reports no other incidents of vomiting afterwards. She states she doesn't like the promethazine because it makes her sleep and she "feels 10x worse than before" she took it.      OB History    Gravida  1   Para      Term      Preterm      AB      Living        SAB      TAB      Ectopic      Multiple      Live Births              Past Medical History:  Diagnosis Date  . Chlamydia   . Gonorrhea   . Hypertension   . Hypoglycemia     Past Surgical History:  Procedure Laterality Date  . HERNIA REPAIR    . UMBILICAL HERNIA REPAIR      Family History  Problem Relation Age of Onset  . Hypertension Other   . Diabetes Other   . Hyperlipidemia Other     Social History   Tobacco Use  . Smoking status: Former Smoker    Packs/day: 0.25    Last attempt to quit: 10/08/2018    Years since quitting: 0.0  . Smokeless tobacco: Never Used  Substance Use Topics  . Alcohol use: No    Alcohol/week: 0.0 standard drinks  . Drug use: Yes    Types: Marijuana    Comment: last use 24 Jan 20    Allergies: No Known Allergies  Medications Prior to Admission  Medication Sig Dispense Refill Last Dose  . metroNIDAZOLE (FLAGYL)  500 MG tablet Take 1 tablet (500 mg total) by mouth 2 (two) times daily. 14 tablet 0 Past Month at Unknown time  . Prenatal Vit-Fe Fumarate-FA (PREPLUS) 27-1 MG TABS Take 1 tablet by mouth daily. 30 tablet 13 10/27/2018 at Unknown time  . promethazine (PHENERGAN) 25 MG tablet Take 0.5-1 tablets (12.5-25 mg total) by mouth every 6 (six) hours as needed. 30 tablet 1 10/27/2018 at Unknown time  . cyclobenzaprine (FLEXERIL) 10 MG tablet Take 1 tablet (10 mg total) by mouth 2 (two) times daily as needed for muscle spasms. 10 tablet 0 More than a month at Unknown time  . ondansetron (ZOFRAN ODT) 4 MG disintegrating tablet Take 1 tablet (4 mg total) by mouth every 8 (eight) hours as needed for nausea or vomiting. (Patient not taking: Reported on 03/30/2018) 20 tablet 0 Completed Course at Unknown time    Review of Systems  Constitutional: Negative for chills and fever.  Gastrointestinal: Positive for nausea and vomiting. Negative for constipation and  diarrhea.  Genitourinary: Negative for dysuria.  Neurological: Negative for dizziness, light-headedness and headaches.   Physical Exam   Blood pressure 130/78, pulse 67, temperature 98.2 F (36.8 C), temperature source Oral, resp. rate 18, height 5\' 4"  (1.626 m), weight 56.7 kg, last menstrual period 09/14/2018, SpO2 99 %.  Physical Exam  Constitutional: She is oriented to person, place, and time. She appears well-developed and well-nourished. No distress.  HENT:  Head: Normocephalic and atraumatic.  Eyes: Conjunctivae are normal.  Neck: Normal range of motion.  Cardiovascular: Normal rate.  Respiratory: Effort normal.  GI: Soft.  Musculoskeletal: Normal range of motion.  Neurological: She is alert and oriented to person, place, and time.  Skin: Skin is warm and dry.  Psychiatric: She has a normal mood and affect. Her behavior is normal.    MAU Course  Procedures Results for orders placed or performed during the hospital encounter of 10/27/18  (from the past 24 hour(s))  Urinalysis, Routine w reflex microscopic     Status: Abnormal   Collection Time: 10/27/18 11:25 PM  Result Value Ref Range   Color, Urine YELLOW YELLOW   APPearance CLEAR CLEAR   Specific Gravity, Urine >1.030 (H) 1.005 - 1.030   pH 6.0 5.0 - 8.0   Glucose, UA NEGATIVE NEGATIVE mg/dL   Hgb urine dipstick NEGATIVE NEGATIVE   Bilirubin Urine NEGATIVE NEGATIVE   Ketones, ur >80 (A) NEGATIVE mg/dL   Protein, ur 30 (A) NEGATIVE mg/dL   Nitrite NEGATIVE NEGATIVE   Leukocytes, UA NEGATIVE NEGATIVE  Urinalysis, Microscopic (reflex)     Status: Abnormal   Collection Time: 10/27/18 11:25 PM  Result Value Ref Range   RBC / HPF 0-5 0 - 5 RBC/hpf   WBC, UA 6-10 0 - 5 WBC/hpf   Bacteria, UA RARE (A) NONE SEEN   Squamous Epithelial / LPF 0-5 0 - 5    MDM Physical Exam  Labs: UA IV with Fluids Antiemetic PO Challenge  Assessment and Plan  23 year old G1P0 at 8.0 weeks Nausea/Vomiting  -Start IV -Give Banana bag over 2 hours -Give Reglan 10mg  IV now -Will reassess after completion of fluids -Consider PO challenge at that time  Follow Up 0200  -Fluids complete -Patient reports toleration of apple juice without issues. -Rx for Reglan 5mg  PO Q6hrs prn -Offered and accepts list for community ob providers -Discussed BRAT Diet with gradual increase of foods into diet. -Encouraged to call or return to MAU if symptoms worsen or with the onset of new symptoms. -Bleeding Precautions given -Discharged to home in improved condition  Cherre Robins MSN, CNM 10/27/2018, 11:47 PM

## 2018-10-27 NOTE — MAU Note (Signed)
Pt here with nausea and vomiting since about 0300. Can't keep anything down. Has some nausea meds at home but "none of them are working." Denies any bleeding.

## 2018-10-28 ENCOUNTER — Ambulatory Visit (HOSPITAL_COMMUNITY): Payer: BLUE CROSS/BLUE SHIELD

## 2018-10-28 DIAGNOSIS — O219 Vomiting of pregnancy, unspecified: Secondary | ICD-10-CM

## 2018-10-28 DIAGNOSIS — Z3A01 Less than 8 weeks gestation of pregnancy: Secondary | ICD-10-CM

## 2018-10-28 MED ORDER — METOCLOPRAMIDE HCL 10 MG PO TABS: 10.0000 mg | ORAL_TABLET | Freq: Four times a day (QID) | ORAL | 1 refills | Status: DC | PRN

## 2018-10-28 NOTE — Discharge Instructions (Signed)
Bland Diet A bland diet consists of foods that are often soft and do not have a lot of fat, fiber, or extra seasonings. Foods without fat, fiber, or seasoning are easier for the body to digest. They are also less likely to irritate your mouth, throat, stomach, and other parts of your digestive system. A bland diet is sometimes called a BRAT diet. What is my plan? Your health care provider or food and nutrition specialist (dietitian) may recommend specific changes to your diet to prevent symptoms or to treat your symptoms. These changes may include:  Eating small meals often.  Cooking food until it is soft enough to chew easily.  Chewing your food well.  Drinking fluids slowly.  Not eating foods that are very spicy, sour, or fatty.  Not eating citrus fruits, such as oranges and grapefruit. What do I need to know about this diet?  Eat a variety of foods from the bland diet food list.  Do not follow a bland diet longer than needed.  Ask your health care provider whether you should take vitamins or supplements. What foods can I eat? Grains  Hot cereals, such as cream of wheat. Rice. Bread, crackers, or tortillas made from refined white flour. Vegetables Canned or cooked vegetables. Mashed or boiled potatoes. Fruits  Bananas. Applesauce. Other types of cooked or canned fruit with the skin and seeds removed, such as canned peaches or pears. Meats and other proteins  Scrambled eggs. Creamy peanut butter or other nut butters. Lean, well-cooked meats, such as chicken or fish. Tofu. Soups or broths. Dairy Low-fat dairy products, such as milk, cottage cheese, or yogurt. Beverages  Water. Herbal tea. Apple juice. Fats and oils Mild salad dressings. Canola or olive oil. Sweets and desserts Pudding. Custard. Fruit gelatin. Ice cream. The items listed above may not be a complete list of recommended foods and beverages. Contact a dietitian for more options. What foods are not  recommended? Grains Whole grain breads and cereals. Vegetables Raw vegetables. Fruits Raw fruits, especially citrus, berries, or dried fruits. Dairy Whole fat dairy foods. Beverages Caffeinated drinks. Alcohol. Seasonings and condiments Strongly flavored seasonings or condiments. Hot sauce. Salsa. Other foods Spicy foods. Fried foods. Sour foods, such as pickled or fermented foods. Foods with high sugar content. Foods high in fiber. The items listed above may not be a complete list of foods and beverages to avoid. Contact a dietitian for more information. Summary  A bland diet consists of foods that are often soft and do not have a lot of fat, fiber, or extra seasonings.  Foods without fat, fiber, or seasoning are easier for the body to digest.  Check with your health care provider to see how long you should follow this diet plan. It is not meant to be followed for long periods. This information is not intended to replace advice given to you by your health care provider. Make sure you discuss any questions you have with your health care provider. Document Released: 12/27/2015 Document Revised: 10/03/2017 Document Reviewed: 10/03/2017 Elsevier Interactive Patient Education  2019 [employer]. [facility] Prenatal Care Providers   [facility] [employer] [facility]       Phone: [phone]  [facility] [employer] at [place] Phone: [phone]  [facility] [employer] at [place]  Phone: [phone]  [facility] [employer] at [employer]  Phone: [phone]  [facility] [employer] at [place]  Phone: [phone]  [facility]       Phone:  [phone]  [facility]    Phone: [phone]   [facility] [place])    Phone: [phone]  [facility]    Phone: [phone]  [facility]    Phone: [phone]   [facility]  Phone: [phone]  [facility]    Phone: [phone]  [facility]   Phone: [phone]  [facility]    Phone: [phone]

## 2018-11-11 LAB — OB RESULTS CONSOLE ABO/RH: RH Type: POSITIVE

## 2018-11-11 LAB — OB RESULTS CONSOLE RPR: RPR: NONREACTIVE

## 2018-11-11 LAB — OB RESULTS CONSOLE ANTIBODY SCREEN: Antibody Screen: NEGATIVE

## 2018-11-11 LAB — OB RESULTS CONSOLE RUBELLA ANTIBODY, IGM: Rubella: IMMUNE

## 2018-11-11 LAB — OB RESULTS CONSOLE GC/CHLAMYDIA
Chlamydia: NEGATIVE
Gonorrhea: NEGATIVE

## 2018-11-11 LAB — OB RESULTS CONSOLE HEPATITIS B SURFACE ANTIGEN: Hepatitis B Surface Ag: NEGATIVE

## 2018-11-11 LAB — OB RESULTS CONSOLE HIV ANTIBODY (ROUTINE TESTING): HIV: NONREACTIVE

## 2018-11-15 DIAGNOSIS — Z3490 Encounter for supervision of normal pregnancy, unspecified, unspecified trimester: Secondary | ICD-10-CM | POA: Insufficient documentation

## 2018-11-21 DIAGNOSIS — F17200 Nicotine dependence, unspecified, uncomplicated: Secondary | ICD-10-CM | POA: Insufficient documentation

## 2018-11-21 DIAGNOSIS — Z8719 Personal history of other diseases of the digestive system: Secondary | ICD-10-CM | POA: Insufficient documentation

## 2018-12-09 ENCOUNTER — Encounter (HOSPITAL_COMMUNITY): Payer: Self-pay | Admitting: *Deleted

## 2018-12-09 ENCOUNTER — Other Ambulatory Visit: Payer: Self-pay

## 2018-12-09 ENCOUNTER — Inpatient Hospital Stay (HOSPITAL_COMMUNITY)
Admission: EM | Admit: 2018-12-09 | Discharge: 2018-12-09 | Disposition: A | Discharge status: Home / Self Care | Payer: BLUE CROSS/BLUE SHIELD | Attending: Obstetrics and Gynecology | Admitting: Obstetrics and Gynecology

## 2018-12-09 DIAGNOSIS — O219 Vomiting of pregnancy, unspecified: Secondary | ICD-10-CM | POA: Insufficient documentation

## 2018-12-09 DIAGNOSIS — Z87891 Personal history of nicotine dependence: Secondary | ICD-10-CM | POA: Insufficient documentation

## 2018-12-09 DIAGNOSIS — O162 Unspecified maternal hypertension, second trimester: Secondary | ICD-10-CM | POA: Diagnosis not present

## 2018-12-09 DIAGNOSIS — Z8249 Family history of ischemic heart disease and other diseases of the circulatory system: Secondary | ICD-10-CM | POA: Insufficient documentation

## 2018-12-09 DIAGNOSIS — Z3A14 14 weeks gestation of pregnancy: Secondary | ICD-10-CM | POA: Diagnosis not present

## 2018-12-09 DIAGNOSIS — E162 Hypoglycemia, unspecified: Secondary | ICD-10-CM | POA: Insufficient documentation

## 2018-12-09 DIAGNOSIS — Z833 Family history of diabetes mellitus: Secondary | ICD-10-CM | POA: Insufficient documentation

## 2018-12-09 DIAGNOSIS — Z79899 Other long term (current) drug therapy: Secondary | ICD-10-CM | POA: Diagnosis not present

## 2018-12-09 DIAGNOSIS — O99810 Abnormal glucose complicating pregnancy: Secondary | ICD-10-CM | POA: Insufficient documentation

## 2018-12-09 LAB — URINALYSIS, ROUTINE W REFLEX MICROSCOPIC
Bilirubin Urine: NEGATIVE
Glucose, UA: NEGATIVE mg/dL
Hgb urine dipstick: NEGATIVE
Ketones, ur: 80 mg/dL — AB
Nitrite: NEGATIVE
Protein, ur: 30 mg/dL — AB
Specific Gravity, Urine: 1.025 (ref 1.005–1.030)
pH: 7 (ref 5.0–8.0)

## 2018-12-09 MED ORDER — ONDANSETRON HCL 4 MG/2ML IJ SOLN
4.0000 mg | Freq: Once | INTRAMUSCULAR | Status: AC
  Administered 2018-12-09: 4 mg via INTRAVENOUS
  Filled 2018-12-09: qty 2

## 2018-12-09 MED ORDER — ONDANSETRON 8 MG PO TBDP: 8.0000 mg | ORAL_TABLET | Freq: Three times a day (TID) | ORAL | 0 refills | Status: DC | PRN

## 2018-12-09 MED ORDER — FAMOTIDINE 20 MG IN NS 100 ML IVPB
20.0000 mg | Freq: Once | INTRAVENOUS | Status: AC
  Administered 2018-12-09: 20 mg via INTRAVENOUS
  Filled 2018-12-09: qty 100

## 2018-12-09 MED ORDER — LACTATED RINGERS IV BOLUS
1000.0000 mL | Freq: Once | INTRAVENOUS | Status: AC
  Administered 2018-12-09: 1000 mL via INTRAVENOUS

## 2018-12-09 NOTE — Discharge Instructions (Signed)

## 2018-12-09 NOTE — MAU Provider Note (Signed)
History     CSN: [id number]  Arrival date and time: 12/09/18 0909   First Provider Initiated Contact with Patient 12/09/18 1012      Chief Complaint  Patient presents with  . Nausea  . Emesis   [name] is a 23 y.o. G1P0 at [redacted]w[redacted]d who presents to MAU for N/V starting around 0300 today. Pt reports issues with N/V earlier this pregnancy and has been seen previously in MAU for this. Pt initially reports N/V started at 0300 today, but later reports she has been "dealing with this for two days." Pt reports she was given an RX for Reglan from MAU last month, but that it "did not work." Pt reports she got an Advertising account executive for Zofran which worked really well for her, but pt reports she cannot remember who prescribed it to her "because she has seen so many different doctors this pregnancy." Pt also reports being given an RX for Diclegis but reports that does not work for her either. Pt also reports a hx this pregnancy of promethazine use, but reports she "didn't want something that would make her so sleepy that she could not participate in normal activities." Pt reports trying ginger ale, B6/Unisom combo at home without success. Pt reports she has not been able to keep down any food or drink for over 48hrs.  Pt reports HA, mild weakness.  Pt denies VB, LOF, ctx, vaginal discharge/odor/itching. Pt denies abdominal pain, constipation, diarrhea, or urinary problems. Pt denies fever, chills, fatigue, sweating or changes in appetite. Pt denies SOB or chest pain. Pt denies dizziness, light-headedness.  Problems this pregnancy include: N/V. Allergies? NKDA Current medications/supplements? PNVs Prenatal care provider? [facility], next appt December 19, 2018    OB History    Gravida  1   Para      Term      Preterm      AB      Living        SAB      TAB      Ectopic      Multiple      Live Births              Past Medical History:  Diagnosis Date  . Chlamydia   . Gonorrhea   .  Hypertension   . Hypoglycemia     Past Surgical History:  Procedure Laterality Date  . HERNIA REPAIR    . UMBILICAL HERNIA REPAIR      Family History  Problem Relation Age of Onset  . Hypertension Other   . Diabetes Other   . Hyperlipidemia Other     Social History   Tobacco Use  . Smoking status: Former Smoker    Packs/day: 0.25    Last attempt to quit: 10/08/2018    Years since quitting: 0.1  . Smokeless tobacco: Never Used  Substance Use Topics  . Alcohol use: No    Alcohol/week: 0.0 standard drinks  . Drug use: Yes    Types: Marijuana    Comment: last use 24 Jan 20    Allergies: No Known Allergies  No medications prior to admission.    Review of Systems  Constitutional: Negative for appetite change, chills, diaphoresis, fatigue and fever.  Respiratory: Negative for shortness of breath.   Cardiovascular: Negative for chest pain.  Gastrointestinal: Positive for nausea and vomiting. Negative for abdominal pain, constipation and diarrhea.  Genitourinary: Negative for difficulty urinating, dysuria, flank pain, frequency, pelvic pain, urgency, vaginal bleeding and vaginal  discharge.  Neurological: Positive for weakness and headaches. Negative for dizziness and light-headedness.   Physical Exam   Blood pressure (!) 97/55, pulse 68, temperature 98.6 F (37 C), temperature source Oral, resp. rate 20, height 5\' 4"  (1.626 m), weight 56.7 kg, last menstrual period 09/14/2018, SpO2 100 %.  Patient Vitals for the past 24 hrs:  BP Temp Temp src Pulse Resp SpO2 Height Weight  12/09/18 1314 (!) 97/55 98.6 F (37 C) Oral 68 20 100 % - -  12/09/18 0955 - - - - - 100 % - -  12/09/18 0953 128/74 98.2 F (36.8 C) Oral 60 18 - - -  12/09/18 0946 - - - - - - 5\' 4"  (1.626 m) 56.7 kg    Physical Exam  Constitutional: She is oriented to person, place, and time. She appears well-developed and well-nourished. No distress.  HENT:  Head: Normocephalic.  Respiratory: Effort  normal.  GI: Soft. She exhibits no distension and no mass. There is no abdominal tenderness. There is no rebound, no guarding and no CVA tenderness.  Neurological: She is alert and oriented to person, place, and time.  Skin: Skin is warm and dry. She is not diaphoretic.  Psychiatric: She has a normal mood and affect. Her behavior is normal.   Results for orders placed or performed during the hospital encounter of 12/09/18 (from the past 24 hour(s))  Urinalysis, Routine w reflex microscopic     Status: Abnormal   Collection Time: 12/09/18  9:55 AM  Result Value Ref Range   Color, Urine YELLOW YELLOW   APPearance HAZY (A) CLEAR   Specific Gravity, Urine 1.025 1.005 - 1.030   pH 7.0 5.0 - 8.0   Glucose, UA NEGATIVE NEGATIVE mg/dL   Hgb urine dipstick NEGATIVE NEGATIVE   Bilirubin Urine NEGATIVE NEGATIVE   Ketones, ur 80 (A) NEGATIVE mg/dL   Protein, ur 30 (A) NEGATIVE mg/dL   Nitrite NEGATIVE NEGATIVE   Leukocytes,Ua SMALL (A) NEGATIVE   RBC / HPF 0-5 0 - 5 RBC/hpf   WBC, UA 0-5 0 - 5 WBC/hpf   Bacteria, UA RARE (A) NONE SEEN   Squamous Epithelial / LPF 0-5 0 - 5   Mucus PRESENT     No results found.   MAU Course  Procedures  MDM -UA shows 80 ketones, 30 PRO, SG 1.025, hazy -urine culture sent d/t presence of leukocytes/bacteria -IV zofran/pepcid given with 1L LR -PO challenge with food and liquids, successful -pt reports HA resolved with fluids and feels well -pt discharged to home in stable condition with RX for Zofran  Orders Placed This Encounter  Procedures  . Culture, OB Urine    Standing Status:   Standing    Number of Occurrences:   1  . Urinalysis, Routine w reflex microscopic    Standing Status:   Standing    Number of Occurrences:   1  . Discharge patient    Order Specific Question:   Discharge disposition    Answer:   01-Home or Self Care [1]    Order Specific Question:   Discharge patient date    Answer:   12/09/2018   Meds ordered this encounter   Medications  . lactated ringers bolus 1,000 mL  . ondansetron (ZOFRAN) injection 4 mg  . famotidine (PEPCID) IVPB 20 mg in NS 100 mL IVPB  . ondansetron (ZOFRAN ODT) 8 MG disintegrating tablet    Sig: Take 1 tablet (8 mg total) by mouth every 8 (eight) hours as  needed for nausea or vomiting.    Dispense:  40 tablet    Refill:  0    Order Specific Question:   Supervising Provider    Answer:   Alysia Penna, MICHAEL L [[id number]]    Assessment and Plan   1. Nausea and vomiting in pregnancy   2. [redacted] weeks gestation of pregnancy    Allergies as of 12/09/2018   No Known Allergies     Medication List    STOP taking these medications   metoCLOPramide 10 MG tablet Commonly known as:  REGLAN     TAKE these medications   ondansetron 8 MG disintegrating tablet Commonly known as:  Zofran ODT Take 1 tablet (8 mg total) by mouth every 8 (eight) hours as needed for nausea or vomiting.   PrePLUS 27-1 MG Tabs Take 1 tablet by mouth daily.      -hyperemesis/MAU return precautions discussed -RX for Zofran given (sufficient quantity given until pt has next OB appt.) -Pt instructed to f/u with OB at appt on April 2nd to discuss maintenance regimen for N/V -pt discharged to home in stable condition  Joni Reining E  12/09/2018, 2:08 PM

## 2018-12-09 NOTE — MAU Note (Signed)
Presents with c/o N&V since 0300 this morning.  Reports unable to keep anything down.  States was taking Zofran, ran out of meds.  States Zofran helped N&V.  Denies VB or abd pain/cramping.

## 2018-12-10 LAB — CULTURE, OB URINE: Culture: NO GROWTH

## 2018-12-19 DIAGNOSIS — Z3402 Encounter for supervision of normal first pregnancy, second trimester: Secondary | ICD-10-CM | POA: Diagnosis not present

## 2018-12-19 DIAGNOSIS — Z3A15 15 weeks gestation of pregnancy: Secondary | ICD-10-CM | POA: Diagnosis not present

## 2018-12-19 DIAGNOSIS — Z363 Encounter for antenatal screening for malformations: Secondary | ICD-10-CM | POA: Diagnosis not present

## 2019-03-11 DIAGNOSIS — O99019 Anemia complicating pregnancy, unspecified trimester: Secondary | ICD-10-CM | POA: Insufficient documentation

## 2019-04-30 ENCOUNTER — Inpatient Hospital Stay (HOSPITAL_COMMUNITY)
Admission: AD | Admit: 2019-04-30 | Discharge: 2019-04-30 | Disposition: A | Discharge status: Home / Self Care | Payer: BLUE CROSS/BLUE SHIELD | Attending: Obstetrics and Gynecology | Admitting: Obstetrics and Gynecology

## 2019-04-30 ENCOUNTER — Other Ambulatory Visit: Payer: Self-pay

## 2019-04-30 ENCOUNTER — Encounter (HOSPITAL_COMMUNITY): Payer: Self-pay

## 2019-04-30 DIAGNOSIS — Z3A34 34 weeks gestation of pregnancy: Secondary | ICD-10-CM | POA: Diagnosis not present

## 2019-04-30 DIAGNOSIS — M549 Dorsalgia, unspecified: Secondary | ICD-10-CM | POA: Insufficient documentation

## 2019-04-30 DIAGNOSIS — O26893 Other specified pregnancy related conditions, third trimester: Secondary | ICD-10-CM | POA: Diagnosis not present

## 2019-04-30 DIAGNOSIS — Z3689 Encounter for other specified antenatal screening: Secondary | ICD-10-CM

## 2019-04-30 DIAGNOSIS — R102 Pelvic and perineal pain: Secondary | ICD-10-CM

## 2019-04-30 DIAGNOSIS — O9989 Other specified diseases and conditions complicating pregnancy, childbirth and the puerperium: Secondary | ICD-10-CM

## 2019-04-30 DIAGNOSIS — Z87891 Personal history of nicotine dependence: Secondary | ICD-10-CM | POA: Diagnosis not present

## 2019-04-30 LAB — URINALYSIS, ROUTINE W REFLEX MICROSCOPIC
Bilirubin Urine: NEGATIVE
Glucose, UA: NEGATIVE mg/dL
Hgb urine dipstick: NEGATIVE
Ketones, ur: 5 mg/dL — AB
Nitrite: NEGATIVE
Protein, ur: NEGATIVE mg/dL
Specific Gravity, Urine: 1.012 (ref 1.005–1.030)
pH: 7 (ref 5.0–8.0)

## 2019-04-30 LAB — WET PREP, GENITAL
Sperm: NONE SEEN
Trich, Wet Prep: NONE SEEN
Yeast Wet Prep HPF POC: NONE SEEN

## 2019-04-30 MED ORDER — COMFORT FIT MATERNITY SUPP MED MISC: 1.0000 | Freq: Every day | 0 refills | Status: DC

## 2019-04-30 MED ORDER — CYCLOBENZAPRINE HCL 10 MG PO TABS: 10.0000 mg | ORAL_TABLET | Freq: Three times a day (TID) | ORAL | 0 refills | Status: DC | PRN

## 2019-04-30 MED ORDER — ACETAMINOPHEN 500 MG PO TABS
1000.0000 mg | ORAL_TABLET | Freq: Four times a day (QID) | ORAL | Status: DC | PRN
  Administered 2019-04-30: 1000 mg via ORAL
  Filled 2019-04-30: qty 2

## 2019-04-30 MED ORDER — CYCLOBENZAPRINE HCL 10 MG PO TABS
10.0000 mg | ORAL_TABLET | Freq: Three times a day (TID) | ORAL | Status: DC | PRN
  Administered 2019-04-30: 10 mg via ORAL
  Filled 2019-04-30: qty 1

## 2019-04-30 NOTE — MAU Note (Signed)
Monique Keith is a 23 y.o. at [redacted]w[redacted]d here in MAU reporting: having lower back and pelvic pain for a couple of days, it got worse last night. States the pain is constant. Has not taken any medication. No vaginal bleeding, abnormal discharge, or LOF. +FM  Onset of complaint: a couple of days  Pain score: back 4/10, pelvis 5/10  Vitals:   04/30/19 0923 04/30/19 0925  BP: 124/84   Pulse: 83   Resp: 16   Temp: 98.4 F (36.9 C)   SpO2:  99%      Lab orders placed from triage: UA

## 2019-04-30 NOTE — MAU Provider Note (Signed)
History     CSN: [id number]  Arrival date and time: 04/30/19 8756   First Provider Initiated Contact with Patient 04/30/19 0931      Chief Complaint  Patient presents with  . Back Pain  . Pelvic Pain   23 y.o. G1 @34 .0 wks presenting for LBP and pelvic pain. Sx started 2 days ago and was intermittent then but became constant last night. Describes as bilateral and central, achy and pain occurs in both areas at the same time. Denies urinary sx. No recent lifting, pulling, pushing, or injury. Denies VB, LOF, and ctx. Reports +FM. She reports eating and drinking well but has not today.   OB History    Gravida  1   Para      Term      Preterm      AB      Living        SAB      TAB      Ectopic      Multiple      Live Births              Past Medical History:  Diagnosis Date  . Chlamydia   . Gonorrhea   . Hypertension   . Hypoglycemia     Past Surgical History:  Procedure Laterality Date  . HERNIA REPAIR    . UMBILICAL HERNIA REPAIR      Family History  Problem Relation Age of Onset  . Hypertension Other   . Diabetes Other   . Hyperlipidemia Other     Social History   Tobacco Use  . Smoking status: Former Smoker    Packs/day: 0.25    Quit date: 10/08/2018    Years since quitting: 0.5  . Smokeless tobacco: Never Used  Substance Use Topics  . Alcohol use: No    Alcohol/week: 0.0 standard drinks  . Drug use: Not Currently    Types: Marijuana    Comment: last use 24 Jan 20    Allergies: No Known Allergies  Medications Prior to Admission  Medication Sig Dispense Refill Last Dose  . ondansetron (ZOFRAN ODT) 8 MG disintegrating tablet Take 1 tablet (8 mg total) by mouth every 8 (eight) hours as needed for nausea or vomiting. 40 tablet 0   . Prenatal Vit-Fe Fumarate-FA (PREPLUS) 27-1 MG TABS Take 1 tablet by mouth daily. 30 tablet 13     Review of Systems  Constitutional: Negative for chills and fever.  Gastrointestinal: Negative for  abdominal pain.  Genitourinary: Positive for pelvic pain. Negative for dysuria, frequency, hematuria, vaginal bleeding and vaginal discharge.  Musculoskeletal: Positive for back pain.   Physical Exam   Blood pressure 115/75, pulse 73, temperature 98.4 F (36.9 C), temperature source Oral, resp. rate 16, height 5\' 3"  (1.6 m), weight 61.6 kg, last menstrual period 09/14/2018, SpO2 99 %.  Physical Exam  Nursing note and vitals reviewed. Constitutional: She is oriented to person, place, and time. She appears well-developed and well-nourished. No distress.  HENT:  Head: Normocephalic and atraumatic.  Neck: Normal range of motion.  Cardiovascular: Normal rate.  Respiratory: Effort normal. No respiratory distress.  GI: Soft. She exhibits no distension and no mass. There is abdominal tenderness in the suprapubic area. There is no rebound and no guarding.  gravid  Genitourinary:    Genitourinary Comments: VE: closed/thick   Musculoskeletal: Normal range of motion.  Neurological: She is alert and oriented to person, place, and time.  Skin: Skin is warm and  dry.  Psychiatric: She has a normal mood and affect.  EFM: 135 bpm, mod variability, + accels, no decels Toco: none  Results for orders placed or performed during the hospital encounter of 04/30/19 (from the past 24 hour(s))  Urinalysis, Routine w reflex microscopic     Status: Abnormal   Collection Time: 04/30/19  9:02 AM  Result Value Ref Range   Color, Urine YELLOW YELLOW   APPearance HAZY (A) CLEAR   Specific Gravity, Urine 1.012 1.005 - 1.030   pH 7.0 5.0 - 8.0   Glucose, UA NEGATIVE NEGATIVE mg/dL   Hgb urine dipstick NEGATIVE NEGATIVE   Bilirubin Urine NEGATIVE NEGATIVE   Ketones, ur 5 (A) NEGATIVE mg/dL   Protein, ur NEGATIVE NEGATIVE mg/dL   Nitrite NEGATIVE NEGATIVE   Leukocytes,Ua SMALL (A) NEGATIVE   RBC / HPF 0-5 0 - 5 RBC/hpf   WBC, UA 0-5 0 - 5 WBC/hpf   Bacteria, UA FEW (A) NONE SEEN   Squamous Epithelial / LPF  6-10 0 - 5   Mucus PRESENT   Wet prep, genital     Status: Abnormal   Collection Time: 04/30/19  9:41 AM  Result Value Ref Range   Yeast Wet Prep HPF POC NONE SEEN NONE SEEN   Trich, Wet Prep NONE SEEN NONE SEEN   Clue Cells Wet Prep HPF POC PRESENT (A) NONE SEEN   WBC, Wet Prep HPF POC MANY (A) NONE SEEN   Sperm NONE SEEN    MAU Course  Procedures Orders Placed This Encounter  Procedures  . Wet prep, genital    Standing Status:   Standing    Number of Occurrences:   1    Order Specific Question:   Patient immune status    Answer:   Normal  . Urinalysis, Routine w reflex microscopic    Standing Status:   Standing    Number of Occurrences:   1  . Discharge patient    Order Specific Question:   Discharge disposition    Answer:   01-Home or Self Care [1]    Order Specific Question:   Discharge patient date    Answer:   04/30/2019   Meds ordered this encounter  Medications  . cyclobenzaprine (FLEXERIL) tablet 10 mg  . acetaminophen (TYLENOL) tablet 1,000 mg  . cyclobenzaprine (FLEXERIL) 10 MG tablet    Sig: Take 1 tablet (10 mg total) by mouth 3 (three) times daily as needed (back pain).    Dispense:  20 tablet    Refill:  0    Order Specific Question:   Supervising Provider    Answer:   Jaynie CollinsANYANWU, UGONNA A [[id number]]  . Elastic Bandages & Supports (COMFORT FIT MATERNITY SUPP MED) MISC    Sig: 1 Device by Does not apply route daily.    Dispense:  1 each    Refill:  0    Order Specific Question:   Supervising Provider    Answer:   Jaynie CollinsANYANWU, UGONNA A [[id number]]   MDM Labs ordered and reviewed. Po hydrated. Pain relieved with meds and heating pad. No evidence of UTI or PTL, likely MSK. Discussed comfort measures. GC pending. Stable for discharge home.   Assessment and Plan   1. [redacted] weeks gestation of pregnancy   2. NST (non-stress test) reactive   3. Back pain affecting pregnancy in third trimester   4. Pelvic pain affecting pregnancy in third trimester, antepartum    Discharge  [facility]  precautions Rx Flexeril Rx maternity support belt Tylenol prn Heating pad prn  Allergies as of 04/30/2019   No Known Allergies     Medication List    TAKE these medications   Comfort Fit Maternity Supp Med Misc 1 Device by Does not apply route daily.   cyclobenzaprine 10 MG tablet Commonly known as: FLEXERIL Take 1 tablet (10 mg total) by mouth 3 (three) times daily as needed (back pain).   ondansetron 8 MG disintegrating tablet Commonly known as: Zofran ODT Take 1 tablet (8 mg total) by mouth every 8 (eight) hours as needed for nausea or vomiting.   PrePLUS 27-1 MG Tabs Take 1 tablet by mouth daily.       Donette LarryMelanie Pate Aylward, CNM 04/30/2019, 11:00 AM

## 2019-04-30 NOTE — Discharge Instructions (Signed)
Back Pain in Pregnancy Back pain during pregnancy is common. Back pain may be caused by several factors that are related to changes during your pregnancy. Follow these instructions at home: Managing pain, stiffness, and swelling      If directed, for sudden (acute) back pain, put ice on the painful area. ? Put ice in a plastic bag. ? Place a towel between your skin and the bag. ? Leave the ice on for 20 minutes, 2-3 times per day.  If directed, apply heat to the affected area before you exercise. Use the heat source that your health care provider recommends, such as a moist heat pack or a heating pad. ? Place a towel between your skin and the heat source. ? Leave the heat on for 20-30 minutes. ? Remove the heat if your skin turns bright red. This is especially important if you are unable to feel pain, heat, or cold. You may have a greater risk of getting burned.  If directed, massage the affected area. Activity  Exercise as told by your health care provider. Gentle exercise is the best way to prevent or manage back pain.  Listen to your body when lifting. If lifting hurts, ask for help or bend your knees. This uses your leg muscles instead of your back muscles.  Squat down when picking up something from the floor. Do not bend over.  Only use bed rest for short periods as told by your health care provider. Bed rest should only be used for the most severe episodes of back pain. Standing, sitting, and lying down  Do not stand in one place for long periods of time.  Use good posture when sitting. Make sure your head rests over your shoulders and is not hanging forward. Use a pillow on your lower back if necessary.  Try sleeping on your side, preferably the left side, with a pregnancy support pillow or 1-2 regular pillows between your legs. ? If you have back pain after a night's rest, your bed may be too soft. ? A firm mattress may provide more support for your back during  pregnancy. General instructions  Do not wear high heels.  Eat a healthy diet. Try to gain weight within your health care provider's recommendations.  Use a maternity girdle, elastic sling, or back brace as told by your health care provider.  Take over-the-counter and prescription medicines only as told by your health care provider.  Work with a physical therapist or massage therapist to find ways to manage back pain. Acupuncture or massage therapy may be helpful.  Keep all follow-up visits as told by your health care provider. This is important. Contact a health care provider if:  Your back pain interferes with your daily activities.  You have increasing pain in other parts of your body. Get help right away if:  You develop numbness, tingling, weakness, or problems with the use of your arms or legs.  You develop severe back pain that is not controlled with medicine.  You have a change in bowel or bladder control.  You develop shortness of breath, dizziness, or you faint.  You develop nausea, vomiting, or sweating.  You have back pain that is a rhythmic, cramping pain similar to labor pains. Labor pain is usually 1-2 minutes apart, lasts for about 1 minute, and involves a bearing down feeling or pressure in your pelvis.  You have back pain and your water breaks or you have vaginal bleeding.  You have back pain or numbness  that travels down your leg.  Your back pain developed after you fell.  You develop pain on one side of your back.  You see blood in your urine.  You develop skin blisters in the area of your back pain. Summary  Back pain may be caused by several factors that are related to changes during your pregnancy.  Follow instructions as told by your health care provider for managing pain, stiffness, and swelling.  Exercise as told by your health care provider. Gentle exercise is the best way to prevent or manage back pain.  Take over-the-counter and  prescription medicines only as told by your health care provider.  Keep all follow-up visits as told by your health care provider. This is important. This information is not intended to replace advice given to you by your health care provider. Make sure you discuss any questions you have with your health care provider. Document Released: 12/13/2005 Document Revised: 12/24/2018 Document Reviewed: 02/20/2018 Elsevier Patient Education  2020 Elsevier Inc.   Abdominal Pain During Pregnancy  Belly (abdominal) pain is common during pregnancy. There are many possible causes. Most of the time, it is not a serious problem. Other times, it can be a sign that something is wrong with the pregnancy. Always tell your doctor if you have belly pain. Follow these instructions at home:  Do not have sex or put anything in your vagina until your pain goes away completely.  Get plenty of rest until your pain gets better.  Drink enough fluid to keep your pee (urine) pale yellow.  Take over-the-counter and prescription medicines only as told by your doctor.  Keep all follow-up visits as told by your doctor. This is important. Contact a doctor if:  Your pain continues or gets worse after resting.  You have lower belly pain that: ? Comes and goes at regular times. ? Spreads to your back. ? Feels like menstrual cramps.  You have pain or burning when you pee (urinate). Get help right away if:  You have a fever or chills.  You have vaginal bleeding.  You are leaking fluid from your vagina.  You are passing tissue from your vagina.  You throw up (vomit) for more than 24 hours.  You have watery poop (diarrhea) for more than 24 hours.  Your baby is moving less than usual.  You feel very weak or faint.  You have shortness of breath.  You have very bad pain in your upper belly. Summary  Belly (abdominal) pain is common during pregnancy. There are many possible causes.  If you have belly pain  during pregnancy, tell your doctor right away.  Keep all follow-up visits as told by your doctor. This is important. This information is not intended to replace advice given to you by your health care provider. Make sure you discuss any questions you have with your health care provider. Document Released: 08/23/2009 Document Revised: 12/23/2018 Document Reviewed: 12/07/2016 Elsevier Patient Education  2020 Elsevier Inc.    PREGNANCY SUPPORT BELT: You are not alone, Seventy-five percent of women have some sort of abdominal or back pain at some point in their pregnancy. Your baby is growing at a fast pace, which means that your whole body is rapidly trying to adjust to the changes. As your uterus grows, your back may start feeling a bit under stress and this can result in back or abdominal pain that can go from mild, and therefore bearable, to severe pains that will not allow you to sit  or lay down comfortably, When it comes to dealing with pregnancy-related pains and cramps, some pregnant women usually prefer natural remedies, which the market is filled with nowadays. For example, wearing a pregnancy support belt can help ease and lessen your discomfort and pain. WHAT ARE THE BENEFITS OF WEARING A PREGNANCY SUPPORT BELT? A pregnancy support belt provides support to the lower portion of the belly taking some of the weight of the growing uterus and distributing to the other parts of your body. It is designed make you comfortable and gives you extra support. Over the years, the pregnancy apparel market has been studying the needs and wants of pregnant women and they have come up with the most comfortable pregnancy support belts that woman could ever ask for. In fact, you will no longer have to wear a stretched-out or bulky pregnancy belt that is visible underneath your clothes and makes you feel even more uncomfortable. Nowadays, a pregnancy support belt is made of comfortable and stretchy materials that  will not irritate your skin but will actually make you feel at ease and you will not even notice you are wearing it. They are easy to put on and adjust during the day and can be worn at night for additional support.  BENEFITS:  Relives Back pain  Relieves Abdominal Muscle and Leg Pain  Stabilizes the Pelvic Ring  Offers a Cushioned Abdominal Lift Pad  Relieves pressure on the Sciatic Nerve Within Minutes WHERE TO GET YOUR PREGNANCY BELT: [employer] [phone] @[phone]  [place], [place] [address]

## 2019-05-01 LAB — GC/CHLAMYDIA PROBE AMP (~~LOC~~) NOT AT ARMC
Chlamydia: NEGATIVE
Neisseria Gonorrhea: NEGATIVE

## 2019-05-16 ENCOUNTER — Telehealth (HOSPITAL_COMMUNITY): Payer: Self-pay | Admitting: *Deleted

## 2019-05-16 NOTE — Telephone Encounter (Signed)
Preadmission screen  

## 2019-05-17 ENCOUNTER — Inpatient Hospital Stay (HOSPITAL_COMMUNITY): Admit: 2019-05-17 | Payer: BLUE CROSS/BLUE SHIELD

## 2019-05-17 ENCOUNTER — Inpatient Hospital Stay (HOSPITAL_COMMUNITY)
Admission: AD | Admit: 2019-05-17 | Payer: Medicaid Other | Source: Home / Self Care | Admitting: Obstetrics and Gynecology

## 2019-05-17 ENCOUNTER — Encounter (HOSPITAL_COMMUNITY): Payer: Self-pay

## 2019-05-17 ENCOUNTER — Other Ambulatory Visit: Payer: Self-pay

## 2019-05-17 ENCOUNTER — Inpatient Hospital Stay (HOSPITAL_COMMUNITY)
Admission: AD | Admit: 2019-05-17 | Discharge: 2019-05-17 | Disposition: A | Discharge status: Home / Self Care | Payer: BC Managed Care – PPO | Attending: Obstetrics & Gynecology | Admitting: Obstetrics & Gynecology

## 2019-05-17 DIAGNOSIS — Z3A36 36 weeks gestation of pregnancy: Secondary | ICD-10-CM | POA: Diagnosis not present

## 2019-05-17 DIAGNOSIS — O4703 False labor before 37 completed weeks of gestation, third trimester: Secondary | ICD-10-CM | POA: Diagnosis not present

## 2019-05-17 DIAGNOSIS — Z3689 Encounter for other specified antenatal screening: Secondary | ICD-10-CM | POA: Diagnosis not present

## 2019-05-17 DIAGNOSIS — O479 False labor, unspecified: Secondary | ICD-10-CM

## 2019-05-17 DIAGNOSIS — R109 Unspecified abdominal pain: Secondary | ICD-10-CM | POA: Diagnosis present

## 2019-05-17 LAB — URINALYSIS, ROUTINE W REFLEX MICROSCOPIC
Bilirubin Urine: NEGATIVE
Glucose, UA: NEGATIVE mg/dL
Hgb urine dipstick: NEGATIVE
Ketones, ur: 5 mg/dL — AB
Nitrite: NEGATIVE
Protein, ur: NEGATIVE mg/dL
Specific Gravity, Urine: 1.016 (ref 1.005–1.030)
pH: 6 (ref 5.0–8.0)

## 2019-05-17 NOTE — MAU Note (Signed)
Monique Keith is a 23 y.o. at [redacted]w[redacted]d here in MAU reporting: for the past 30 minutes she has had a lot of pelvic pressure. States the pressure is very painful. It is constant. No bleeding, no LOF. +FM  Onset of complaint: within the past 30 min  Pain score: 7/10  Vitals:   05/17/19 1821  BP: 126/80  Pulse: 86  Resp: 18  Temp: 98.5 F (36.9 C)  SpO2: 100%     FHT: +FM  Lab orders placed from triage: UA, states she does not need to use bathroom at this time

## 2019-05-17 NOTE — MAU Provider Note (Signed)
None    S: [name] is a 23 y.o. G1P0 at [redacted]w[redacted]d  who presents to MAU today complaining of contractions for the past 30  minutes. She denies vaginal bleeding. She denies LOF. She reports normal fetal movement.    O: BP 126/80 (BP Location: Left Arm)   Pulse 86   Temp 98.5 F (36.9 C) (Oral)   Resp 18   LMP 09/14/2018   SpO2 100%  GENERAL: Well-developed, well-nourished female in no acute distress.  HEAD: Normocephalic, atraumatic.  CHEST: Normal effort of breathing, regular heart rate ABDOMEN: Soft, nontender, gravid  Cervical exam:  Dilation: Fingertip Effacement (%): 70 Cervical Position: Posterior Station: Ballotable Exam by:: K.WIlson,RN  Fetal Monitoring: Baseline: 135 Variability: Mod  Accelerations: 15 x 15 Decelerations: None Contractions: Irregular, occasional  A: SIUP at [redacted]w[redacted]d  Cervix remains FT/thick/posterior Contraction pain resolving without intervention per pt  P: Discharge home in stable condition  Mallie Snooks, CNM 05/17/2019 8:45 PM

## 2019-05-17 NOTE — Discharge Instructions (Signed)

## 2019-05-19 ENCOUNTER — Encounter (HOSPITAL_COMMUNITY): Payer: Self-pay | Admitting: *Deleted

## 2019-05-20 DIAGNOSIS — Z22330 Carrier of Group B streptococcus: Secondary | ICD-10-CM | POA: Insufficient documentation

## 2019-05-20 LAB — OB RESULTS CONSOLE GBS: GBS: POSITIVE

## 2019-05-27 ENCOUNTER — Other Ambulatory Visit: Payer: Self-pay | Admitting: Obstetrics & Gynecology

## 2019-05-31 ENCOUNTER — Other Ambulatory Visit: Payer: Self-pay

## 2019-05-31 ENCOUNTER — Inpatient Hospital Stay (HOSPITAL_COMMUNITY)
Admission: RE | Admit: 2019-05-31 | Discharge: 2019-05-31 | Disposition: A | Discharge status: Home / Self Care | Payer: BC Managed Care – PPO | Source: Ambulatory Visit | Attending: Obstetrics & Gynecology | Admitting: Obstetrics & Gynecology

## 2019-05-31 LAB — SARS CORONAVIRUS 2 (TAT 6-24 HRS): SARS Coronavirus 2: NEGATIVE

## 2019-05-31 NOTE — MAU Note (Signed)
Pt here for PAT covid swab, denies symptoms. Swab collected. 

## 2019-06-01 ENCOUNTER — Other Ambulatory Visit: Payer: Self-pay

## 2019-06-01 ENCOUNTER — Encounter (HOSPITAL_COMMUNITY): Payer: Self-pay

## 2019-06-01 ENCOUNTER — Inpatient Hospital Stay (HOSPITAL_COMMUNITY)
Admission: AD | Admit: 2019-06-01 | Discharge: 2019-06-06 | DRG: 787 | Disposition: A | Discharge status: Home / Self Care | Payer: BC Managed Care – PPO | Attending: Obstetrics & Gynecology | Admitting: Obstetrics & Gynecology

## 2019-06-01 ENCOUNTER — Inpatient Hospital Stay (HOSPITAL_COMMUNITY)
Admission: AD | Admit: 2019-06-01 | Payer: BC Managed Care – PPO | Source: Ambulatory Visit | Admitting: Obstetrics & Gynecology

## 2019-06-01 DIAGNOSIS — O365990 Maternal care for other known or suspected poor fetal growth, unspecified trimester, not applicable or unspecified: Secondary | ICD-10-CM | POA: Diagnosis present

## 2019-06-01 DIAGNOSIS — O9962 Diseases of the digestive system complicating childbirth: Secondary | ICD-10-CM | POA: Diagnosis present

## 2019-06-01 DIAGNOSIS — D62 Acute posthemorrhagic anemia: Secondary | ICD-10-CM | POA: Diagnosis not present

## 2019-06-01 DIAGNOSIS — O9081 Anemia of the puerperium: Secondary | ICD-10-CM | POA: Diagnosis not present

## 2019-06-01 DIAGNOSIS — K219 Gastro-esophageal reflux disease without esophagitis: Secondary | ICD-10-CM | POA: Diagnosis present

## 2019-06-01 DIAGNOSIS — O365930 Maternal care for other known or suspected poor fetal growth, third trimester, not applicable or unspecified: Principal | ICD-10-CM | POA: Diagnosis present

## 2019-06-01 DIAGNOSIS — Z20828 Contact with and (suspected) exposure to other viral communicable diseases: Secondary | ICD-10-CM | POA: Diagnosis present

## 2019-06-01 DIAGNOSIS — Z87891 Personal history of nicotine dependence: Secondary | ICD-10-CM | POA: Diagnosis not present

## 2019-06-01 DIAGNOSIS — Z3490 Encounter for supervision of normal pregnancy, unspecified, unspecified trimester: Secondary | ICD-10-CM | POA: Diagnosis present

## 2019-06-01 DIAGNOSIS — Z3A38 38 weeks gestation of pregnancy: Secondary | ICD-10-CM | POA: Diagnosis not present

## 2019-06-01 DIAGNOSIS — Z8759 Personal history of other complications of pregnancy, childbirth and the puerperium: Secondary | ICD-10-CM | POA: Diagnosis present

## 2019-06-01 DIAGNOSIS — O99824 Streptococcus B carrier state complicating childbirth: Secondary | ICD-10-CM | POA: Diagnosis present

## 2019-06-01 LAB — CBC
HCT: 33.2 % — ABNORMAL LOW (ref 36.0–46.0)
Hemoglobin: 10.7 g/dL — ABNORMAL LOW (ref 12.0–15.0)
MCH: 26.4 pg (ref 26.0–34.0)
MCHC: 32.2 g/dL (ref 30.0–36.0)
MCV: 81.8 fL (ref 80.0–100.0)
Platelets: 283 10*3/uL (ref 150–400)
RBC: 4.06 MIL/uL (ref 3.87–5.11)
RDW: 13.3 % (ref 11.5–15.5)
WBC: 12 10*3/uL — ABNORMAL HIGH (ref 4.0–10.5)
nRBC: 0 % (ref 0.0–0.2)

## 2019-06-01 LAB — TYPE AND SCREEN
ABO/RH(D): A POS
Antibody Screen: NEGATIVE

## 2019-06-01 MED ORDER — SODIUM CHLORIDE 0.9 % IV SOLN
5.0000 10*6.[IU] | Freq: Once | INTRAVENOUS | Status: AC
  Administered 2019-06-01: 5 10*6.[IU] via INTRAVENOUS
  Filled 2019-06-01: qty 5

## 2019-06-01 MED ORDER — BUTORPHANOL TARTRATE 1 MG/ML IJ SOLN
1.0000 mg | INTRAMUSCULAR | Status: DC | PRN
  Administered 2019-06-02 (×2): 1 mg via INTRAVENOUS
  Filled 2019-06-01 (×2): qty 1

## 2019-06-01 MED ORDER — SOD CITRATE-CITRIC ACID 500-334 MG/5ML PO SOLN
30.0000 mL | ORAL | Status: DC | PRN
  Administered 2019-06-03: 30 mL via ORAL
  Filled 2019-06-01: qty 30

## 2019-06-01 MED ORDER — MISOPROSTOL 50MCG HALF TABLET
50.0000 ug | ORAL_TABLET | ORAL | Status: DC
  Administered 2019-06-01 – 2019-06-02 (×2): 50 ug via BUCCAL
  Filled 2019-06-01 (×2): qty 1

## 2019-06-01 MED ORDER — LIDOCAINE HCL (PF) 1 % IJ SOLN: 30.0000 mL | INTRAMUSCULAR | Status: DC | PRN

## 2019-06-01 MED ORDER — LACTATED RINGERS IV SOLN
500.0000 mL | INTRAVENOUS | Status: DC | PRN
  Administered 2019-06-02: 09:00:00 250 mL via INTRAVENOUS
  Administered 2019-06-02: 17:00:00 300 mL via INTRAVENOUS
  Administered 2019-06-02: 500 mL via INTRAVENOUS

## 2019-06-01 MED ORDER — ZOLPIDEM TARTRATE 5 MG PO TABS
5.0000 mg | ORAL_TABLET | Freq: Once | ORAL | Status: AC
  Administered 2019-06-01: 5 mg via ORAL
  Filled 2019-06-01: qty 1

## 2019-06-01 MED ORDER — PENICILLIN G POTASSIUM 5000000 UNITS IJ SOLR
3.0000 10*6.[IU] | INTRAMUSCULAR | Status: DC
  Filled 2019-06-01 (×2): qty 5

## 2019-06-01 MED ORDER — OXYTOCIN BOLUS FROM INFUSION: 500.0000 mL | Freq: Once | INTRAVENOUS | Status: DC

## 2019-06-01 MED ORDER — OXYTOCIN 40 UNITS IN NORMAL SALINE INFUSION - SIMPLE MED: 2.5000 [IU]/h | INTRAVENOUS | Status: DC

## 2019-06-01 MED ORDER — ONDANSETRON HCL 4 MG/2ML IJ SOLN
4.0000 mg | Freq: Four times a day (QID) | INTRAMUSCULAR | Status: DC | PRN
  Administered 2019-06-02 – 2019-06-03 (×3): 4 mg via INTRAVENOUS
  Filled 2019-06-01 (×2): qty 2

## 2019-06-01 MED ORDER — FAMOTIDINE 20 MG PO TABS: 20.0000 mg | ORAL_TABLET | Freq: Two times a day (BID) | ORAL | Status: DC

## 2019-06-01 MED ORDER — OXYTOCIN 10 UNIT/ML IJ SOLN: 10.0000 [IU] | Freq: Once | INTRAMUSCULAR | Status: DC

## 2019-06-01 MED ORDER — LACTATED RINGERS IV SOLN
INTRAVENOUS | Status: DC
  Administered 2019-06-01 – 2019-06-03 (×6): via INTRAVENOUS

## 2019-06-01 MED ORDER — ONDANSETRON 4 MG PO TBDP
4.0000 mg | ORAL_TABLET | Freq: Three times a day (TID) | ORAL | Status: DC | PRN
  Administered 2019-06-01: 4 mg via ORAL
  Filled 2019-06-01: qty 1

## 2019-06-01 MED ORDER — PENICILLIN G POTASSIUM 5000000 UNITS IJ SOLR
5.0000 10*6.[IU] | Freq: Once | INTRAMUSCULAR | Status: DC
  Filled 2019-06-01: qty 5

## 2019-06-01 MED ORDER — ACETAMINOPHEN 325 MG PO TABS: 650.0000 mg | ORAL_TABLET | ORAL | Status: DC | PRN

## 2019-06-01 MED ORDER — PENICILLIN G 3 MILLION UNITS IVPB - SIMPLE MED
3.0000 10*6.[IU] | INTRAVENOUS | Status: DC
  Administered 2019-06-02 – 2019-06-03 (×7): 3 10*6.[IU] via INTRAVENOUS
  Filled 2019-06-01 (×8): qty 100

## 2019-06-01 NOTE — H&P (Addendum)
OB ADMISSION/ HISTORY & PHYSICAL:  Admission Date: 06/01/2019  7:37 PM  Admit Diagnosis: pregnancy    Monique Keith is a 23 y.o. female presenting for scheduled IOL 2/2 IUGR. MOther is her support person in labor, FOB involved.   Prenatal History: G1P0   EDC : 06/11/2019, by Other Basis  Prenatal care at [facility] since 11 wks  Primary provider Dr. Alesia Richards  Prenatal course complicated by: IUGR, growth sono 36 wks EFW 20%tile 5#10", AFI13.7 cm MFM consult  reccs IOL 38-39 wks Followed with weekly BPP/NST after 34 wks  Reflux - on pepcid and Zofran at night  Hx smoking, quit with + UPT  GBS positive  Prenatal Labs: ABO, Rh: A (02/24 0000)  Antibody: Negative (02/24 0000) Rubella: Immune (02/24 0000)  RPR: Nonreactive (02/24 0000)  HBsAg: Negative (02/24 0000)  HIV: Non-reactive (02/24 0000)  GBS:   positive 1 hr Glucola : 116 Genetic Screening: [employer] normal Ultrasound: normal XX anatomy, anterior placenta    Maternal Diabetes: No Genetic Screening: Normal Maternal Ultrasounds/Referrals: IUGR Fetal Ultrasounds or other Referrals:  None Maternal Substance Abuse:  No Significant Maternal Medications:  Meds include: Other: Pepcid, Zofran Significant Maternal Lab Results:  Group B Strep positive Other Comments:  None  Medical / Surgical History :  Past medical history:  Past Medical History:  Diagnosis Date  . Chlamydia   . Gonorrhea   . Hypertension   . Hypoglycemia      Past surgical history:  Past Surgical History:  Procedure Laterality Date  . HERNIA REPAIR    . UMBILICAL HERNIA REPAIR       Family History:  Family History  Problem Relation Age of Onset  . Hypertension Maternal Grandmother   . Hypertension Maternal Grandfather   . Hyperlipidemia Maternal Grandfather   . Diabetes Maternal Grandfather      Social History:  reports that she quit smoking about 7 months ago. She smoked 0.25 packs per day. She has never used  smokeless tobacco. She reports previous drug use. Drug: Marijuana. She reports that she does not drink alcohol.   Allergies: Patient has no known allergies.   Current Medications at time of admission:  Medications Prior to Admission  Medication Sig Dispense Refill Last Dose  . cyclobenzaprine (FLEXERIL) 10 MG tablet Take 1 tablet (10 mg total) by mouth 3 (three) times daily as needed (back pain). 20 tablet 0   . Elastic Bandages & Supports (COMFORT FIT MATERNITY SUPP MED) MISC 1 Device by Does not apply route daily. 1 each 0   . ondansetron (ZOFRAN ODT) 8 MG disintegrating tablet Take 1 tablet (8 mg total) by mouth every 8 (eight) hours as needed for nausea or vomiting. 40 tablet 0   . Prenatal Vit-Fe Fumarate-FA (PREPLUS) 27-1 MG TABS Take 1 tablet by mouth daily. 30 tablet 13      Review of Systems: ROS Denies LOF/VB, + FM No HA/NV/RUQ pain Physical Exam: Vital signs and nursing notes reviewed.  Vitals:   06/01/19 1955  BP: 124/89  Pulse: 89  Resp: 18  Temp: 98.5 F (36.9 C)    General: AAO x 3, NAD Heart: RRR Lungs:CTAB Abdomen: Gravid, NT, Leopold's vertex Extremities: no edema Genitalia / VE:   1-2/thick/-2 posterior Attempted cervical balloon but unsuccessful, patient too uncomfortable and cervix very posterior and at angle FHR: 135 BPM, mod variability, + accels, no decels TOCO: Ctx none  Labs:   Pending T&S, CBC, RPR  No results for input(s):  WBC, HGB, HCT, PLT in the last 72 hours.     Assessment:  23 y.o. G1P0 at 4628w4d, IUGR for IOL   1. Unfavorable cervix 2. FHR category 1 3. GBS positive 4. Desires epidural in labor 5. Plans to bottlefeed but may consider breast, nipple piercing in place   Plan:  1. Admit to BS 2. Routine L&D orders 3. Analgesia/anesthesia PRN  4. Buccal cytotec 50 mcg q 4 hrs x 2-3 doses as needed, plan cervical balloon if needed in am 5. GBS prophylaxis - PCN per protocol in active labor or with ROM 6. Anticipate  NSVB   Dr Sallye OberKulwa notified of admission / plan of care   Neta MendsDaniela C Paul CNM, MSN 06/01/2019, 8:01 PM

## 2019-06-01 NOTE — Anesthesia Preprocedure Evaluation (Addendum)
Anesthesia Evaluation  Patient identified by MRN, date of birth, ID band Patient awake    Reviewed: Allergy & Precautions, NPO status , Patient's Chart, lab work & pertinent test results  Airway Mallampati: II  TM Distance: >3 FB Neck ROM: Full    Dental no notable dental hx.    Pulmonary former smoker,    Pulmonary exam normal breath sounds clear to auscultation       Cardiovascular Exercise Tolerance: Good hypertension, negative cardio ROS Normal cardiovascular exam Rhythm:Regular Rate:Normal     Neuro/Psych negative neurological ROS  negative psych ROS   GI/Hepatic negative GI ROS, Neg liver ROS,   Endo/Other  negative endocrine ROS  Renal/GU negative Renal ROS     Musculoskeletal   Abdominal   Peds  Hematology Hgb 10.7 Plt 283   Anesthesia Other Findings   Reproductive/Obstetrics (+) Pregnancy                            Anesthesia Physical Anesthesia Plan  ASA: II  Anesthesia Plan: Epidural   Post-op Pain Management:    Induction:   PONV Risk Score and Plan:   Airway Management Planned:   Additional Equipment:   Intra-op Plan:   Post-operative Plan:   Informed Consent: I have reviewed the patients History and Physical, chart, labs and discussed the procedure including the risks, benefits and alternatives for the proposed anesthesia with the patient or authorized representative who has indicated his/her understanding and acceptance.       Plan Discussed with:   Anesthesia Plan Comments: (38.4wk G1P0 for LEA)      Anesthesia Quick Evaluation

## 2019-06-02 ENCOUNTER — Inpatient Hospital Stay (HOSPITAL_COMMUNITY): Payer: BC Managed Care – PPO

## 2019-06-02 ENCOUNTER — Inpatient Hospital Stay (HOSPITAL_COMMUNITY): Payer: BC Managed Care – PPO | Admitting: Anesthesiology

## 2019-06-02 LAB — ABO/RH: ABO/RH(D): A POS

## 2019-06-02 LAB — RPR: RPR Ser Ql: NONREACTIVE

## 2019-06-02 MED ORDER — PHENYLEPHRINE 40 MCG/ML (10ML) SYRINGE FOR IV PUSH (FOR BLOOD PRESSURE SUPPORT): 80.0000 ug | PREFILLED_SYRINGE | INTRAVENOUS | Status: DC | PRN

## 2019-06-02 MED ORDER — FENTANYL-BUPIVACAINE-NACL 0.5-0.125-0.9 MG/250ML-% EP SOLN: 12.0000 mL/h | EPIDURAL | Status: DC | PRN

## 2019-06-02 MED ORDER — EPHEDRINE 5 MG/ML INJ: 10.0000 mg | INTRAVENOUS | Status: DC | PRN

## 2019-06-02 MED ORDER — DIPHENHYDRAMINE HCL 50 MG/ML IJ SOLN: 12.5000 mg | INTRAMUSCULAR | Status: DC | PRN

## 2019-06-02 MED ORDER — OXYTOCIN 40 UNITS IN NORMAL SALINE INFUSION - SIMPLE MED
1.0000 m[IU]/min | INTRAVENOUS | Status: DC
  Administered 2019-06-03: 2 m[IU]/min via INTRAVENOUS
  Filled 2019-06-02: qty 1000

## 2019-06-02 MED ORDER — OXYTOCIN 40 UNITS IN NORMAL SALINE INFUSION - SIMPLE MED
1.0000 m[IU]/min | INTRAVENOUS | Status: DC
  Administered 2019-06-02: 2 m[IU]/min via INTRAVENOUS
  Filled 2019-06-02: qty 1000

## 2019-06-02 MED ORDER — TERBUTALINE SULFATE 1 MG/ML IJ SOLN: 0.2500 mg | Freq: Once | INTRAMUSCULAR | Status: DC | PRN

## 2019-06-02 MED ORDER — LACTATED RINGERS IV SOLN
500.0000 mL | Freq: Once | INTRAVENOUS | Status: AC
  Administered 2019-06-02: 500 mL via INTRAVENOUS

## 2019-06-02 MED ORDER — FENTANYL-BUPIVACAINE-NACL 0.5-0.125-0.9 MG/250ML-% EP SOLN
12.0000 mL/h | EPIDURAL | Status: DC | PRN
  Administered 2019-06-03: 01:00:00 12 mL/h via EPIDURAL
  Filled 2019-06-02 (×2): qty 250

## 2019-06-02 MED ORDER — PHENYLEPHRINE 40 MCG/ML (10ML) SYRINGE FOR IV PUSH (FOR BLOOD PRESSURE SUPPORT)
80.0000 ug | PREFILLED_SYRINGE | INTRAVENOUS | Status: DC | PRN
  Filled 2019-06-02: qty 10

## 2019-06-02 MED ORDER — LACTATED RINGERS IV SOLN: 500.0000 mL | Freq: Once | INTRAVENOUS | Status: DC

## 2019-06-02 MED ORDER — LIDOCAINE HCL (PF) 1 % IJ SOLN
INTRAMUSCULAR | Status: DC | PRN
  Administered 2019-06-02: 5 mL via EPIDURAL

## 2019-06-02 NOTE — Progress Notes (Signed)
Between the times of 0608-0622 on 06/02/2019 it was difficult to trace fetal heart rate due to maternal movement due to pain. RN was in room assessing and readjusting monitor as needed.  Martinique Chablis Losh RN

## 2019-06-02 NOTE — Progress Notes (Signed)
Monique Keith is a 23 y.o. G1P0 at 33w5dadmitted for induction of labor due to SGA.  Subjective:  Comfortable with  Epidural Contractions every 2-3 minutes, lasting 60 seconds, moderate    Objective: BP 124/84   Pulse 64   Temp 98 F (36.7 C) (Oral)   Resp 18   Ht 5\' 4"  (1.626 m)   Wt 65.4 kg   LMP 09/14/2018   SpO2 100%   BMI 24.73 kg/m  No intake/output data recorded. Total I/O In: -  Out: 850 [Urine:850]  FHT:  Category 1 SVE:   Dilation: 5 Effacement (%): 80 Station: -1 Exam by:: Tayvien Kane MD   AROM with abundant clear amniotic fluid  Labs: Lab Results  Component Value Date   WBC 12.0 (H) 06/01/2019   HGB 10.7 (L) 06/01/2019   HCT 33.2 (L) 06/01/2019   MCV 81.8 06/01/2019   PLT 283 06/01/2019    Assessment / Plan: Induction of labor due to SGA,  progressing well on pitocin at 3 mU/min Fetal Wellbeing: reassuring Anticipated MOD:  NSVD  Monique Keith 06/02/2019, 2:29 PM

## 2019-06-02 NOTE — Progress Notes (Signed)
Call to nurse station regarding PIV consult. Confirmed PIV obtained and consult no longer needed.

## 2019-06-02 NOTE — Anesthesia Procedure Notes (Signed)
Epidural Patient location during procedure: OB Start time: 06/02/2019 6:30 AM End time: 06/02/2019 6:45 AM  Staffing Anesthesiologist: Barnet Glasgow, MD Performed: anesthesiologist   Preanesthetic Checklist Completed: patient identified, site marked, surgical consent, pre-op evaluation, timeout performed, IV checked, risks and benefits discussed and monitors and equipment checked  Epidural Patient position: sitting Prep: site prepped and draped and DuraPrep Patient monitoring: continuous pulse ox and blood pressure Approach: midline Location: L3-L4 Injection technique: LOR air  Needle:  Needle type: Tuohy  Needle gauge: 17 G Needle length: 9 cm and 9 Needle insertion depth: 6 cm Catheter type: closed end flexible Catheter size: 19 Gauge Catheter at skin depth: 11 cm Test dose: negative  Assessment Events: blood not aspirated, injection not painful, no injection resistance, negative IV test and no paresthesia  Additional Notes Patient identified. Risks/Benefits/Options discussed with patient including but not limited to bleeding, infection, nerve damage, paralysis, failed block, incomplete pain control, headache, blood pressure changes, nausea, vomiting, reactions to medication both or allergic, itching and postpartum back pain. Confirmed with bedside nurse the patient's most recent platelet count. Confirmed with patient that they are not currently taking any anticoagulation, have any bleeding history or any family history of bleeding disorders. Patient expressed understanding and wished to proceed. All questions were answered. Sterile technique was used throughout the entire procedure. Please see nursing notes for vital signs. Test dose was given through epidural needle and negative prior to continuing to dose epidural or start infusion. Warning signs of high block given to the patient including shortness of breath, tingling/numbness in hands, complete motor block, or any  concerning symptoms with instructions to call for help. Patient was given instructions on fall risk and not to get out of bed. All questions and concerns addressed with instructions to call with any issues.  1Attempt (S) . Patient tolerated procedure well.

## 2019-06-02 NOTE — Progress Notes (Signed)
Labor Progress Note  Monique Keith, 23 y.o., G1P0, with an IUP @ [redacted]w[redacted]d, presented for IOL due to Dr Cletis Media stated SGA. Last [place] was 20% at 36 weeks 5.10 lbs.   Subjective: Pt was in bed lying on her right side on a peanut ball, pt in stable condition, comfortable with epidural and no complaints.  Patient Active Problem List   Diagnosis Date Noted  . Encounter for induction of labor 06/01/2019  . IUGR (intrauterine growth restriction) affecting care of mother 06/01/2019   Objective: BP 114/68   Pulse 76   Temp 98.3 F (36.8 C) (Axillary)   Resp 19   Ht 5\' 4"  (1.626 m)   Wt 65.4 kg   LMP 09/14/2018   SpO2 100%   BMI 24.73 kg/m  I/O last 3 completed shifts: In: -  Out: 850 [Urine:850] Total I/O In: -  Out: 1350 [Urine:1350] NST: FHR baseline 140 bpm, Variability: moderate, Accelerations:present, Decelerations:  Absent= Cat 1/Reactive, also reactive with scalp stimulation  CTX:  regular, every 2-3 minutes, lasting 60-90 seconds Uterus gravid, soft non tender, moderate to palpate with contractions.  SVE:  Dilation: 7 Effacement (%): 90 Station: 0 Exam by:: Erin Foley RN Pitocin at 16mUn/min  SVE yield cxt felt mild in strength Swelling noted around cervix Vertex at 0-+1, low but cervix still estimated at 5cm for my exam.  Large amount of clear fluid expressed when fetal head gentle applied upward pressure.  IUPC in place, MVU 180s  Assessment:  Monique Keith, 23 y.o., G1P0, with an IUP @ [redacted]w[redacted]d, presented for IOL due to Dr Cletis Media stated SGA. Last [place] was 20% at 36 weeks 5.10 lbs. Protracted labor on pitocin, my exam was not the same as prior MD. SVE was 5/80/0 for myself. Was able to expelled a ton of fluid. Under the impression there was to much fluid to allow fetal head to fully dilate cervix. Internal exam reviewed mild cxt, with MVU of 180. Patient Active Problem List   Diagnosis Date Noted  . Encounter for induction of labor 06/01/2019  . IUGR (intrauterine growth  restriction) affecting care of mother 06/01/2019   NICHD: Category 1  Membranes:  AROM @ 1700 on 9/14, no s/s of infection  MVUs 180  Induction:    Cytotec x2 @ 2047 on 9/13, x1 48mcg on 9/14 @ 0210  Pitocin - 12  Pain management:               IV pain management: x2 of 1mg  stadol @ 0140 & 0422 on 9/14             Epidural placement:  at 0630 on 9/14  GBS positive   Abx: x6 doses of penicillin   Nausea: Zofran 1427 IV   Plan: Continue labor plan Nausea: PRN zofran  Continuous monitoring Rest Frequent position changes to facilitate fetal rotation and descent with peanut Will reassess with cervical exam at 0000 on 9/15 or earlier if necessary Continue pitocin per protocol Anticipate labor progression and vaginal delivery.   Md [place] aware of plan and verbalized agreement.   Noralyn Pick, NP-C, CNM, MSN 06/02/2019. 8:32 PM

## 2019-06-02 NOTE — Progress Notes (Signed)
Labor Progress Note  Monique Keith, 23 y.o., G1P0, with an IUP @ [redacted]w[redacted]d, presented for IOL due to Dr Cletis Media stated SGA. Last [place] was 20% at 36 weeks 5.10 lbs.   Subjective: Pt was in bed lying and stated she is tired, pt wants to sleep, in NAD. Patient Active Problem List   Diagnosis Date Noted  . Encounter for induction of labor 06/01/2019  . IUGR (intrauterine growth restriction) affecting care of mother 06/01/2019   Objective: BP 127/65   Pulse 79   Temp 98.8 F (37.1 C) (Oral)   Resp 18   Ht 5\' 4"  (1.626 m)   Wt 65.4 kg   LMP 09/14/2018   SpO2 100%   BMI 24.73 kg/m  I/O last 3 completed shifts: In: -  Out: 850 [Urine:850] Total I/O In: -  Out: 1700 [Urine:1700] NST: FHR baseline 140 bpm, Variability: moderate, Accelerations:present, Decelerations:  Absent= Cat 1/Reactive, also reactive with scalp stimulation  CTX:  irregular, every 2-4 minutes, lasting 60-90 seconds Uterus gravid, soft non tender, moderate to palpate with contractions.  SVE:  Dilation: 5(pt feeling constant/intense pressure ) Effacement (%): 90 Station: 0, Plus 1 Exam by:: Lesia Sago, RN  Pitocin at 65mUn/min  SVE yield cxt felt mild in strength Swelling noted around cervix IUPC in place, MVU 160s  Assessment:  Monique Keith, 23 y.o., G1P0, with an IUP @ [redacted]w[redacted]d, presented for IOL due to Dr Cletis Media stated SGA. Last [place] was 20% at 36 weeks 5.10 lbs. Protracted labor on pitocin,cxt not currently adaquate with MVU of 160. Cxt starting to become irregular with no change, uterus appears tired, will give pit break.  Patient Active Problem List   Diagnosis Date Noted  . Encounter for induction of labor 06/01/2019  . IUGR (intrauterine growth restriction) affecting care of mother 06/01/2019   NICHD: Category 1  Membranes:  AROM @ 1700 on 9/14, no s/s of infection  MVUs 160  Induction:    Cytotec x2 @ 2047 on 9/13, x1 69mcg on 9/14 @ 0210  Pitocin - 20  Pain management:               IV pain management: x2  of 1mg  stadol @ 0140 & 0422 on 9/14             Epidural placement:  at 0630 on 9/14  GBS positive   Abx: x7 doses of penicillin   Nausea: Zofran 1427 IV   Plan: Continue labor plan Nausea: PRN zofran  Continuous monitoring Rest Frequent position changes to facilitate fetal rotation and descent with peanut Will reassess with cervical exam if necessary, decrease checks to decrease infections Pit break 1 hour, then restart pitocin with new bag at 2 Continue pitocin per protocol Anticipate labor progression and vaginal delivery.   Md [place] aware of plan and verbalized agreement.   Noralyn Pick, NP-C, CNM, MSN 06/02/2019. 11:50 PM

## 2019-06-02 NOTE — Progress Notes (Signed)
Patient ID: Monique Keith, female   DOB: 27-Jan-1996, 23 y.o.   MRN: [id number] S: C/O painful ctx, s/p one dose Stadol at 0130, with moderate effect.  Cervical ripening ongoing, Cytotec x 2, last at 0210.    O: Vitals:   06/01/19 2353 06/02/19 0005 06/02/19 0120 06/02/19 0300  BP:  134/90 113/78 (!) 148/88  Pulse:  65 78 92  Resp:  18 18 20   Temp: (!) 97.2 F (36.2 C)     TempSrc: Axillary     Weight:      Height:         FHT:  FHR: 125 bpm, variability: moderate,  accelerations:  Present,  decelerations:  Absent UC:   irregular, every 1-4 minutes SVE:   Dilation: 3 Effacement (%): 70 Station: -1 Exam by:: Monique Keith, CNM IBOW  A / P: Induction of labor due to IUGR,  progressing well on cytotec x 2 doses Plan Pitocin augmentation 4 hrs after last Cytotec dose  Fetal Wellbeing:  Category I Pain Control:  IV pain meds  Anticipated MOD:  NSVD  Monique Keith, CNM, MSN 06/02/2019, 4:22 AM

## 2019-06-03 ENCOUNTER — Encounter (HOSPITAL_COMMUNITY)
Admission: AD | Disposition: A | Discharge status: Home / Self Care | Payer: Self-pay | Source: Home / Self Care | Attending: Obstetrics & Gynecology

## 2019-06-03 ENCOUNTER — Encounter (HOSPITAL_COMMUNITY): Payer: Self-pay | Admitting: *Deleted

## 2019-06-03 SURGERY — Surgical Case
Anesthesia: Epidural | Site: Abdomen | Wound class: Clean Contaminated

## 2019-06-03 MED ORDER — NALBUPHINE SYRINGE 5 MG/0.5 ML
5.0000 mg | INJECTION | INTRAMUSCULAR | Status: DC | PRN
  Filled 2019-06-03: qty 0.5

## 2019-06-03 MED ORDER — ONDANSETRON HCL 4 MG/2ML IJ SOLN
INTRAMUSCULAR | Status: AC
  Filled 2019-06-03: qty 2

## 2019-06-03 MED ORDER — MENTHOL 3 MG MT LOZG: 1.0000 | LOZENGE | OROMUCOSAL | Status: DC | PRN

## 2019-06-03 MED ORDER — COCONUT OIL OIL: 1.0000 "application " | TOPICAL_OIL | Status: DC | PRN

## 2019-06-03 MED ORDER — PHENYLEPHRINE HCL (PRESSORS) 10 MG/ML IV SOLN
INTRAVENOUS | Status: DC | PRN
  Administered 2019-06-03 (×4): 100 ug via INTRAVENOUS

## 2019-06-03 MED ORDER — OXYCODONE HCL 5 MG/5ML PO SOLN: 5.0000 mg | Freq: Once | ORAL | Status: DC | PRN

## 2019-06-03 MED ORDER — MEPERIDINE HCL 25 MG/ML IJ SOLN
INTRAMUSCULAR | Status: AC
  Filled 2019-06-03: qty 1

## 2019-06-03 MED ORDER — FENTANYL CITRATE (PF) 100 MCG/2ML IJ SOLN
25.0000 ug | INTRAMUSCULAR | Status: DC | PRN
  Administered 2019-06-03: 25 ug via INTRAVENOUS
  Administered 2019-06-03: 50 ug via INTRAVENOUS

## 2019-06-03 MED ORDER — ACETAMINOPHEN 160 MG/5ML PO SOLN: 325.0000 mg | ORAL | Status: DC | PRN

## 2019-06-03 MED ORDER — DIBUCAINE (PERIANAL) 1 % EX OINT: 1.0000 "application " | TOPICAL_OINTMENT | CUTANEOUS | Status: DC | PRN

## 2019-06-03 MED ORDER — SODIUM CHLORIDE 0.9% FLUSH: 3.0000 mL | INTRAVENOUS | Status: DC | PRN

## 2019-06-03 MED ORDER — SIMETHICONE 80 MG PO CHEW
80.0000 mg | CHEWABLE_TABLET | Freq: Three times a day (TID) | ORAL | Status: DC
  Administered 2019-06-03 – 2019-06-06 (×8): 80 mg via ORAL
  Filled 2019-06-03 (×8): qty 1

## 2019-06-03 MED ORDER — SCOPOLAMINE 1 MG/3DAYS TD PT72: 1.0000 | MEDICATED_PATCH | Freq: Once | TRANSDERMAL | Status: DC

## 2019-06-03 MED ORDER — OXYTOCIN 40 UNITS IN NORMAL SALINE INFUSION - SIMPLE MED: 2.5000 [IU]/h | INTRAVENOUS | Status: AC

## 2019-06-03 MED ORDER — PRENATAL MULTIVITAMIN CH
1.0000 | ORAL_TABLET | Freq: Every day | ORAL | Status: DC
  Administered 2019-06-03 – 2019-06-06 (×4): 1 via ORAL
  Filled 2019-06-03 (×4): qty 1

## 2019-06-03 MED ORDER — SODIUM CHLORIDE 0.9 % IV SOLN
INTRAVENOUS | Status: DC | PRN
  Administered 2019-06-03: 40 [IU] via INTRAVENOUS

## 2019-06-03 MED ORDER — SODIUM CHLORIDE 0.9 % IV SOLN
INTRAVENOUS | Status: DC | PRN
  Administered 2019-06-03: 07:00:00 via INTRAVENOUS

## 2019-06-03 MED ORDER — IBUPROFEN 600 MG PO TABS
600.0000 mg | ORAL_TABLET | Freq: Four times a day (QID) | ORAL | Status: DC | PRN
  Administered 2019-06-04 – 2019-06-06 (×11): 600 mg via ORAL
  Filled 2019-06-03 (×11): qty 1

## 2019-06-03 MED ORDER — SIMETHICONE 80 MG PO CHEW: 80.0000 mg | CHEWABLE_TABLET | ORAL | Status: DC | PRN

## 2019-06-03 MED ORDER — SIMETHICONE 80 MG PO CHEW
80.0000 mg | CHEWABLE_TABLET | ORAL | Status: DC
  Administered 2019-06-04 (×2): 80 mg via ORAL
  Filled 2019-06-03 (×2): qty 1

## 2019-06-03 MED ORDER — DEXAMETHASONE SODIUM PHOSPHATE 10 MG/ML IJ SOLN
INTRAMUSCULAR | Status: AC
  Filled 2019-06-03: qty 1

## 2019-06-03 MED ORDER — WITCH HAZEL-GLYCERIN EX PADS: 1.0000 "application " | MEDICATED_PAD | CUTANEOUS | Status: DC | PRN

## 2019-06-03 MED ORDER — DIPHENHYDRAMINE HCL 25 MG PO CAPS: 25.0000 mg | ORAL_CAPSULE | ORAL | Status: DC | PRN

## 2019-06-03 MED ORDER — SOD CITRATE-CITRIC ACID 500-334 MG/5ML PO SOLN: 30.0000 mL | ORAL | Status: DC

## 2019-06-03 MED ORDER — MORPHINE SULFATE (PF) 0.5 MG/ML IJ SOLN
INTRAMUSCULAR | Status: AC
  Filled 2019-06-03: qty 10

## 2019-06-03 MED ORDER — DIPHENHYDRAMINE HCL 50 MG/ML IJ SOLN: 12.5000 mg | INTRAMUSCULAR | Status: DC | PRN

## 2019-06-03 MED ORDER — KETOROLAC TROMETHAMINE 30 MG/ML IJ SOLN
30.0000 mg | Freq: Four times a day (QID) | INTRAMUSCULAR | Status: AC | PRN
  Administered 2019-06-03: 13:00:00 30 mg via INTRAVENOUS
  Filled 2019-06-03: qty 1

## 2019-06-03 MED ORDER — ZOLPIDEM TARTRATE 5 MG PO TABS: 5.0000 mg | ORAL_TABLET | Freq: Every evening | ORAL | Status: DC | PRN

## 2019-06-03 MED ORDER — DIPHENHYDRAMINE HCL 25 MG PO CAPS: 25.0000 mg | ORAL_CAPSULE | Freq: Four times a day (QID) | ORAL | Status: DC | PRN

## 2019-06-03 MED ORDER — LACTATED RINGERS IV SOLN
INTRAVENOUS | Status: DC
  Administered 2019-06-03 (×2): via INTRAVENOUS

## 2019-06-03 MED ORDER — LIDOCAINE-EPINEPHRINE (PF) 2 %-1:200000 IJ SOLN
INTRAMUSCULAR | Status: DC | PRN
  Administered 2019-06-03 (×2): 5 mL via EPIDURAL

## 2019-06-03 MED ORDER — DEXAMETHASONE SODIUM PHOSPHATE 10 MG/ML IJ SOLN
INTRAMUSCULAR | Status: DC | PRN
  Administered 2019-06-03: 4 mg via INTRAVENOUS

## 2019-06-03 MED ORDER — PROMETHAZINE HCL 25 MG/ML IJ SOLN
12.5000 mg | Freq: Once | INTRAMUSCULAR | Status: AC
  Administered 2019-06-03: 12.5 mg via INTRAVENOUS
  Filled 2019-06-03: qty 1

## 2019-06-03 MED ORDER — NALOXONE HCL 0.4 MG/ML IJ SOLN: 0.4000 mg | INTRAMUSCULAR | Status: DC | PRN

## 2019-06-03 MED ORDER — MORPHINE SULFATE (PF) 0.5 MG/ML IJ SOLN
INTRAMUSCULAR | Status: DC | PRN
  Administered 2019-06-03: 3 mg via EPIDURAL

## 2019-06-03 MED ORDER — ONDANSETRON HCL 4 MG/2ML IJ SOLN: 4.0000 mg | Freq: Three times a day (TID) | INTRAMUSCULAR | Status: DC | PRN

## 2019-06-03 MED ORDER — FENTANYL CITRATE (PF) 100 MCG/2ML IJ SOLN
INTRAMUSCULAR | Status: AC
  Filled 2019-06-03: qty 2

## 2019-06-03 MED ORDER — ACETAMINOPHEN 325 MG PO TABS: 325.0000 mg | ORAL_TABLET | ORAL | Status: DC | PRN

## 2019-06-03 MED ORDER — SENNOSIDES-DOCUSATE SODIUM 8.6-50 MG PO TABS
2.0000 | ORAL_TABLET | ORAL | Status: DC
  Administered 2019-06-04 – 2019-06-05 (×3): 2 via ORAL
  Filled 2019-06-03 (×3): qty 2

## 2019-06-03 MED ORDER — OXYCODONE-ACETAMINOPHEN 5-325 MG PO TABS
1.0000 | ORAL_TABLET | ORAL | Status: DC | PRN
  Administered 2019-06-04 – 2019-06-05 (×3): 1 via ORAL
  Filled 2019-06-03 (×3): qty 1

## 2019-06-03 MED ORDER — MEPERIDINE HCL 25 MG/ML IJ SOLN: 6.2500 mg | INTRAMUSCULAR | Status: DC | PRN

## 2019-06-03 MED ORDER — PHENYLEPHRINE 40 MCG/ML (10ML) SYRINGE FOR IV PUSH (FOR BLOOD PRESSURE SUPPORT)
PREFILLED_SYRINGE | INTRAVENOUS | Status: AC
  Filled 2019-06-03: qty 20

## 2019-06-03 MED ORDER — MEPERIDINE HCL 25 MG/ML IJ SOLN
INTRAMUSCULAR | Status: DC | PRN
  Administered 2019-06-03: 25 mg via INTRAVENOUS

## 2019-06-03 MED ORDER — KETOROLAC TROMETHAMINE 30 MG/ML IJ SOLN: 30.0000 mg | Freq: Four times a day (QID) | INTRAMUSCULAR | Status: AC | PRN

## 2019-06-03 MED ORDER — NALBUPHINE SYRINGE 5 MG/0.5 ML
5.0000 mg | INJECTION | Freq: Once | INTRAMUSCULAR | Status: AC | PRN
  Filled 2019-06-03: qty 0.5

## 2019-06-03 MED ORDER — NALBUPHINE SYRINGE 5 MG/0.5 ML
5.0000 mg | INJECTION | Freq: Once | INTRAMUSCULAR | Status: AC | PRN
  Administered 2019-06-03: 5 mg via SUBCUTANEOUS
  Filled 2019-06-03 (×2): qty 0.5

## 2019-06-03 MED ORDER — ACETAMINOPHEN 10 MG/ML IV SOLN
INTRAVENOUS | Status: AC
  Filled 2019-06-03: qty 100

## 2019-06-03 MED ORDER — OXYCODONE HCL 5 MG PO TABS: 5.0000 mg | ORAL_TABLET | Freq: Once | ORAL | Status: DC | PRN

## 2019-06-03 MED ORDER — NALOXONE HCL 4 MG/10ML IJ SOLN
1.0000 ug/kg/h | INTRAVENOUS | Status: DC | PRN
  Filled 2019-06-03: qty 5

## 2019-06-03 MED ORDER — ACETAMINOPHEN 10 MG/ML IV SOLN
1000.0000 mg | Freq: Once | INTRAVENOUS | Status: AC
  Administered 2019-06-03: 1000 mg via INTRAVENOUS

## 2019-06-03 MED ORDER — SODIUM CHLORIDE 0.9 % IR SOLN
Status: DC | PRN
  Administered 2019-06-03: 1000 mL

## 2019-06-03 MED ORDER — ONDANSETRON HCL 4 MG/2ML IJ SOLN: 4.0000 mg | Freq: Once | INTRAMUSCULAR | Status: DC | PRN

## 2019-06-03 MED ORDER — OXYTOCIN 40 UNITS IN NORMAL SALINE INFUSION - SIMPLE MED
INTRAVENOUS | Status: AC
  Filled 2019-06-03: qty 1000

## 2019-06-03 MED ORDER — METHYLERGONOVINE MALEATE 0.2 MG/ML IJ SOLN
INTRAMUSCULAR | Status: DC | PRN
  Administered 2019-06-03: 0.2 mg via INTRAMUSCULAR

## 2019-06-03 MED ORDER — CEFAZOLIN SODIUM-DEXTROSE 2-4 GM/100ML-% IV SOLN
INTRAVENOUS | Status: AC
  Filled 2019-06-03: qty 100

## 2019-06-03 MED ORDER — TETANUS-DIPHTH-ACELL PERTUSSIS 5-2.5-18.5 LF-MCG/0.5 IM SUSP: 0.5000 mL | Freq: Once | INTRAMUSCULAR | Status: DC

## 2019-06-03 MED ORDER — CEFAZOLIN SODIUM-DEXTROSE 2-4 GM/100ML-% IV SOLN
2.0000 g | INTRAVENOUS | Status: AC
  Administered 2019-06-03: 2 g via INTRAVENOUS

## 2019-06-03 SURGICAL SUPPLY — 40 items
APL SKNCLS STERI-STRIP NONHPOA (GAUZE/BANDAGES/DRESSINGS) ×1
BARRIER ADHS 3X4 INTERCEED (GAUZE/BANDAGES/DRESSINGS) ×2 IMPLANT
BENZOIN TINCTURE PRP APPL 2/3 (GAUZE/BANDAGES/DRESSINGS) ×3 IMPLANT
BRR ADH 4X3 ABS CNTRL BYND (GAUZE/BANDAGES/DRESSINGS) ×1
CHLORAPREP W/TINT 26ML (MISCELLANEOUS) ×3 IMPLANT
CLAMP CORD UMBIL (MISCELLANEOUS) IMPLANT
CLOSURE WOUND 1/2 X4 (GAUZE/BANDAGES/DRESSINGS) ×1
CLOTH BEACON ORANGE TIMEOUT ST (SAFETY) ×3 IMPLANT
DRSG OPSITE POSTOP 4X10 (GAUZE/BANDAGES/DRESSINGS) ×3 IMPLANT
ELECT REM PT RETURN 9FT ADLT (ELECTROSURGICAL) ×3
ELECTRODE REM PT RTRN 9FT ADLT (ELECTROSURGICAL) ×1 IMPLANT
EXTRACTOR VACUUM KIWI (MISCELLANEOUS) IMPLANT
GLOVE BIOGEL M 7.0 STRL (GLOVE) ×6 IMPLANT
GLOVE BIOGEL PI IND STRL 7.0 (GLOVE) ×3 IMPLANT
GLOVE BIOGEL PI INDICATOR 7.0 (GLOVE) ×6
GOWN STRL REUS W/TWL LRG LVL3 (GOWN DISPOSABLE) ×9 IMPLANT
KIT ABG SYR 3ML LUER SLIP (SYRINGE) IMPLANT
NDL HYPO 25X5/8 SAFETYGLIDE (NEEDLE) IMPLANT
NEEDLE HYPO 25X5/8 SAFETYGLIDE (NEEDLE) IMPLANT
NS IRRIG 1000ML POUR BTL (IV SOLUTION) ×3 IMPLANT
PACK C SECTION WH (CUSTOM PROCEDURE TRAY) ×3 IMPLANT
PAD OB MATERNITY 4.3X12.25 (PERSONAL CARE ITEMS) ×3 IMPLANT
PENCIL SMOKE EVAC W/HOLSTER (ELECTROSURGICAL) ×3 IMPLANT
RTRCTR C-SECT PINK 25CM LRG (MISCELLANEOUS) IMPLANT
STRIP CLOSURE SKIN 1/2X4 (GAUZE/BANDAGES/DRESSINGS) ×2 IMPLANT
SUT PDS AB 0 CT1 27 (SUTURE) ×6 IMPLANT
SUT PLAIN 0 NONE (SUTURE) IMPLANT
SUT PLAIN 2 0 (SUTURE) ×3
SUT PLAIN ABS 2-0 CT1 27XMFL (SUTURE) IMPLANT
SUT VIC AB 0 CT1 36 (SUTURE) ×2 IMPLANT
SUT VIC AB 0 CTX 36 (SUTURE) ×9
SUT VIC AB 0 CTX36XBRD ANBCTRL (SUTURE) ×3 IMPLANT
SUT VIC AB 2-0 CT1 27 (SUTURE) ×3
SUT VIC AB 2-0 CT1 TAPERPNT 27 (SUTURE) ×1 IMPLANT
SUT VIC AB 3-0 SH 27 (SUTURE)
SUT VIC AB 3-0 SH 27X BRD (SUTURE) IMPLANT
SUT VIC AB 4-0 KS 27 (SUTURE) ×3 IMPLANT
TOWEL OR 17X24 6PK STRL BLUE (TOWEL DISPOSABLE) ×3 IMPLANT
TRAY FOLEY W/BAG SLVR 14FR LF (SET/KITS/TRAYS/PACK) ×3 IMPLANT
WATER STERILE IRR 1000ML POUR (IV SOLUTION) ×5 IMPLANT

## 2019-06-03 NOTE — Transfer of Care (Signed)
Immediate Anesthesia Transfer of Care Note  Patient: Monique Keith  Procedure(s) Performed: CESAREAN SECTION (N/A Abdomen)  Patient Location: PACU  Anesthesia Type:Epidural  Level of Consciousness: awake, alert  and oriented  Airway & Oxygen Therapy: Patient Spontanous Breathing  Post-op Assessment: Report given to RN and Post -op Vital signs reviewed and stable  Post vital signs: Reviewed and stable  Last Vitals:  Vitals Value Taken Time  BP 128/70 06/03/19 0809  Temp    Pulse 70 06/03/19 0814  Resp 12 06/03/19 0814  SpO2 100 % 06/03/19 0814  Vitals shown include unvalidated device data.  Last Pain:  Vitals:   06/03/19 0808  TempSrc: (P) Oral  PainSc:          Complications: No apparent anesthesia complications

## 2019-06-03 NOTE — Progress Notes (Signed)
SBAR report called to [employer] on pt - to transfer in approx 30-55min - CWhitlock RNC

## 2019-06-03 NOTE — Op Note (Signed)
Cesarean Section Procedure Note  Indications: failure to progress: arrest of dilation  Pre-operative Diagnosis: 38 week 6 day pregnancy.  Post-operative Diagnosis: same  Surgeon: Christophe Louis M.D and Crawford Givens M.D,  Assistants: None  Anesthesia: Epidural anesthesia  ASA Class: 2   Procedure Details   The patient was seen in the Holding Room. The risks, benefits, complications, treatment options, and expected outcomes were discussed with the patient.  The patient concurred with the proposed plan, giving informed consent.  The site of surgery properly noted/marked. The patient was taken to Operating Room # C, identified as Monique Keith and the procedure verified as C-Section Delivery. A Time Out was held and the above information confirmed.  After induction of anesthesia, the patient was draped and prepped in the usual sterile manner. A Pfannenstiel incision was made and carried down through the subcutaneous tissue to the fascia. Fascial incision was made and extended transversely. The fascia was separated from the underlying rectus tissue superiorly and inferiorly. The peritoneum was identified and entered. Peritoneal incision was extended longitudinally. The utero-vesical peritoneal reflection was incised transversely and the bladder flap was bluntly freed from the lower uterine segment. A low transverse uterine incision was made. Delivered from cephalic presentation was a   Female with Apgar scores of 8 at one minute and 9 at five minutes. After the umbilical cord was clamped and cut cord blood was obtained for evaluation. The placenta was removed intact and appeared normal. The uterine outline, tubes and ovaries appeared normal. The uterine incision was closed with running locked sutures of 0 vicryl . A second layer of 0 vicryl was used to imbricate the incision .Marland Kitchen Hemostasis was observed. Lavage was carried out until clear. Interseed was placed along the fascial incision. At this point Dr.  Crawford Givens completed the remainder of the surgery please see her dictation .  I  Findings: Female infant in cephalic presentation .Marland Kitchen occiput posterior position. Normal appearing fallopian tubes and ovaries   Estimated Blood Loss:  600 to 700 mL during my portion of the procedure          Drains: None         Total IV Fluids:  Per anesthesia ml         Specimens: Placenta and Disposition:  Sent to Pathology          Implants: None         Complications:  None; patient tolerated the procedure well.           Attending Attestation: I performed the procedure.

## 2019-06-03 NOTE — Anesthesia Postprocedure Evaluation (Signed)
Anesthesia Post Note  Patient: Monique Keith  Procedure(s) Performed: CESAREAN SECTION (N/A Abdomen)     Anesthesia Type: Epidural    Last Vitals:  Vitals:   06/03/19 1020 06/03/19 1125  BP: (!) 153/96 (!) 144/98  Pulse: 62 65  Resp: 18 18  Temp: 36.9 C 36.4 C  SpO2: 97% 99%    Last Pain:  Vitals:   06/03/19 1125  TempSrc: Oral  PainSc: 3    Pain Goal: Patients Stated Pain Goal: 3 (06/03/19 1125)                 Madigan Rosensteel

## 2019-06-03 NOTE — Progress Notes (Signed)
Dr  Landry Mellow called and consulted about the need for  [facility] for pt. Pt is failure to progress and descent. SVE remains at 5/90/0,+1 with caput. Cervical swelling. No change since yesterday aftyer, had IUPC, but cxt never really adequate for longer then 30 mins, stopped pit and restarted after getting to 20unit, now at 16, no change, not adequate, pt requesting g PCS. Dr Landry Mellow en route to assess and consent for PCS.

## 2019-06-03 NOTE — Progress Notes (Signed)
   Subjective: Called to evaluate patient secondary to lack of cervical change for over 12 hours. Pt requesting cesarean section.   Objective: BP (!) 103/58   Pulse 70   Temp 99.1 F (37.3 C) (Oral)   Resp 18   Ht 5\' 4"  (1.626 m)   Wt 65.4 kg   LMP 09/14/2018   SpO2 100%   BMI 24.73 kg/m  I/O last 3 completed shifts: In: -  Out: 850 [Urine:850] Total I/O In: -  Out: 3275 [Urine:3275]  FHT:  FHR: 140 bpm, variability: moderate,  accelerations:  Present,  decelerations:  Absent UC:   regular, every 2-3 minutes IUPC dislodged at 430 pm.  SVE:   Dilation: 5.5 Effacement (%): 90 Station: 0, Plus 1 Exam by:: Dr. Landry Mellow  Labs: Lab Results  Component Value Date   WBC 12.0 (H) 06/01/2019   HGB 10.7 (L) 06/01/2019   HCT 33.2 (L) 06/01/2019   MCV 81.8 06/01/2019   PLT 283 06/01/2019    Assessment / Plan: Protracted active phase with failure to progress. Discussed with patient that her MVU's have nto been adequate. D/w pt option of cesarean section that she request vs continue pitocin to reach adequate mvu's and performing cesarean section if no change at that time.  Pt declines continuing with pitocin and request cesarean section. R/B/A of cesarean section discussed with the patient including but not limited to infection bleeding damage to bowel bladder and baby with the need for further surger. R/o Transfusion discussed. Pt voiced understanding and desires to proceed.   Christophe Louis 06/03/2019, 6:19 AM

## 2019-06-03 NOTE — Op Note (Signed)
Cesarean Section Procedure Note   Kaedence Connelly  06/03/2019  Indications: Failed induction for SGA   Pre-operative Diagnosis: failure to progress.   Post-operative Diagnosis: Same   Surgeon: Surgeon(s) and Role:    Christophe Louis, MD - Primary    * Crawford Givens, MD   Assistants: Katina Degree CNM   Anesthesia: epidural   Procedure Details:  I came in to the room to relieve Dr Landry Mellow.   The uterine incision was closed by Dr Landry Mellow.  The left angle had some oozing.  I placed a figure 8 suture.      Hemostasis was observed.. The alexsis was removed.  The peritoneum was closed with 0 chromic.  The muscles were examined and any bleeders were made hemostatic using bovie cautery device.   The fascia was then reapproximated with running sutures of 0 vicryl.  The subcutaneous tissue was reapproximated  With interrupted stitches using 2-0 plain gut. The subcuticular closure was performed using 3-81monocryl     Instrument, sponge, and needle counts were correct prior the abdominal closure and were correct at the conclusion of the case.       Estimated Blood Loss: 800   Total IV Fluids: 2755ml   Urine Output: 800CC OF clear urine  Specimens:   Complications: no complications  Disposition: PACU - hemodynamically stable.   Maternal Condition: stable   Baby condition / location:  Couplet care / Skin to Skin  Attending Attestation: see above   Signed: Surgeon(s): Christophe Louis, MD Crawford Givens, MD

## 2019-06-03 NOTE — Progress Notes (Signed)
Labor Progress Note  Monique Keith, 23 y.o., G1P0, with an IUP @ [redacted]w[redacted]d, presented for IOL due to Dr Cletis Media stated SGA. Last [place] was 20% at 36 weeks 5.10 lbs.   Subjective: Pt called RN and requested to have a CS, Reviewed R/B/A for CS, including bleeding, internal organ damage and infection, pt verbalized consent and Consent to blood products.  Patient Active Problem List   Diagnosis Date Noted  . Encounter for induction of labor 06/01/2019  . IUGR (intrauterine growth restriction) affecting care of mother 06/01/2019   Objective: BP 116/67   Pulse 71   Temp 98.2 F (36.8 C) (Oral)   Resp 16   Ht 5\' 4"  (1.626 m)   Wt 65.4 kg   LMP 09/14/2018   SpO2 100%   BMI 24.73 kg/m  I/O last 3 completed shifts: In: -  Out: 850 [Urine:850] Total I/O In: -  Out: 2450 [Urine:2450] NST: FHR baseline 140 bpm, Variability: moderate, Accelerations:present, Decelerations:  Absent= Cat 1/Reactive, also reactive with scalp stimulation  CTX:  irregular, every 2-9 minutes, lasting 60-90 seconds Uterus gravid, soft non tender, moderate to palpate with contractions.  SVE:  Dilation: 5 Effacement (%): 90 Station: 0, Plus 1 Exam by:: [place], CNM  Pitocin at 79mUn/min  SVE yield cxt felt mild in strength Swelling noted around cervix Fetal Caput noted.  IUPC in place, MVU 110s  Assessment:  Monique Keith, 23 y.o., G1P0, with an IUP @ [redacted]w[redacted]d, presented for IOL due to Dr Cletis Media stated SGA. Last [place] was 20% at 36 weeks 5.10 lbs. Protracted labor on pitocin,No progression noted, requested by pt to be checked and desired PCS. Consulted with Dr Landry Mellow. Instructed to allow 4 hours for attempt of adequate cxt before proceeding with PCS.  Patient Active Problem List   Diagnosis Date Noted  . Encounter for induction of labor 06/01/2019  . IUGR (intrauterine growth restriction) affecting care of mother 06/01/2019   NICHD: Category 1  Membranes:  AROM @ 1700 on 9/14, no s/s of infection, temp 99.2  MVUs  110  Induction:    Cytotec x2 @ 2047 on 9/13, x1 54mcg on 9/14 @ 0210  Pitocin - 20  Pain management:               IV pain management: x2 of 1mg  stadol @ 0140 & 0422 on 9/14             Epidural placement:  at 0630 on 9/14  GBS positive   Abx: x8 doses of penicillin   Nausea: Zofran 1427 IV   Plan: Continue labor plan Nausea: 12.5 phenergan  Continuous monitoring Rest Frequent position changes to facilitate fetal rotation and descent with peanut Will reassess with cervical exam if necessary, decrease checks to decrease infections, next exam @ 5am Continue pitocin per protocol 2x2 for total of 4 hours after restarting, if no change, pt requesting CS Anticipate labor progression and vaginal delivery.   Md Lena aware of plan and verbalized agreement.   Noralyn Pick, NP-C, CNM, MSN 06/03/2019. 4:12 AM

## 2019-06-04 LAB — CBC
HCT: 23.9 % — ABNORMAL LOW (ref 36.0–46.0)
Hemoglobin: 7.6 g/dL — ABNORMAL LOW (ref 12.0–15.0)
MCH: 25.9 pg — ABNORMAL LOW (ref 26.0–34.0)
MCHC: 31.8 g/dL (ref 30.0–36.0)
MCV: 81.6 fL (ref 80.0–100.0)
Platelets: 200 10*3/uL (ref 150–400)
RBC: 2.93 MIL/uL — ABNORMAL LOW (ref 3.87–5.11)
RDW: 13.7 % (ref 11.5–15.5)
WBC: 18.3 10*3/uL — ABNORMAL HIGH (ref 4.0–10.5)
nRBC: 0 % (ref 0.0–0.2)

## 2019-06-04 MED ORDER — FERROUS SULFATE 325 (65 FE) MG PO TABS
325.0000 mg | ORAL_TABLET | Freq: Two times a day (BID) | ORAL | Status: DC
  Administered 2019-06-04 – 2019-06-06 (×4): 325 mg via ORAL
  Filled 2019-06-04 (×4): qty 1

## 2019-06-04 NOTE — Progress Notes (Addendum)
Patient ID: Monique Keith, female   DOB: [birth year], 23 y.o.   MRN: [id number] Subjective: POD# 1 Live born female  Birth Weight: 6 lb 1.2 oz (2755 g) APGAR: 8, 9  Newborn Delivery   Birth date/time: 06/03/2019 07:14:00 Delivery type: C-Section, Low Transverse Trial of labor: No C-section categorization: Primary     Delivering provider: Christophe Louis   Feeding: bottle  Pain control at delivery: Epidural   Reports feeling tired but well.  Patient reports tolerating PO.   Breast symptoms: none Pain controlled with PO meds Denies HA/SOB/C/P/N/V/dizziness. Flatus present, minimal. She reports vaginal bleeding as normal, without clots.  She is ambulating, urinating without difficulty.     Objective:   VS:    Vitals:   06/03/19 1700 06/03/19 2042 06/04/19 0014 06/04/19 0505  BP:  122/86 121/86 114/77  Pulse:  66 71 70  Resp: 18 18 18 18   Temp: 99.1 F (37.3 C) 98.4 F (36.9 C) 98.4 F (36.9 C) 98.3 F (36.8 C)  TempSrc: Oral Oral Oral Oral  SpO2: 99% 100% 100% 99%  Weight:      Height:          Intake/Output Summary (Last 24 hours) at 06/04/2019 0541 Last data filed at 06/04/2019 0500 Gross per 24 hour  Intake 6667.9 ml  Output 5562 ml  Net 1105.9 ml        Recent Labs    06/01/19 2045  WBC 12.0*  HGB 10.7*  HCT 33.2*  PLT 283     Blood type: --/--/A POS, A POS Performed at [facility] [address] [place], [place] [address]  [phone]/13 2045)  Rubella: Immune (02/24 0000)    Physical Exam:  General: alert, cooperative and no distress CV: Regular rate and rhythm Resp: clear Abdomen: soft, non-tender, decreased bowel sounds Incision: intact and serous and small amount drainage present on honeycomb dressing Uterine Fundus: firm, below umbilicus, surgically tender Lochia: minimal Ext: no edema, redness or tenderness in the calves or thighs      Assessment/Plan: 23 y.o.   POD# 1. G1P0                  Principal Problem:   Postpartum care  following cesarean delivery 9/15 Active Problems:   Encounter for induction of labor   IUGR (intrauterine growth restriction) affecting care of mother   Cesarean delivery delivered - arrest of dilation 5 cm   Doing well, stable.               Advance diet as tolerated Encourage rest when baby rests Encourage to ambulate, warm fluids to increase gut motility Routine post-op care Postpartum contraception - plans DMPA inj.  Juliene Pina, CNM, MSN 06/04/2019, 5:41 AM  Call from Dr Lennie Hummer: baby is transferred to NICU for evaluation for not feeding for 24 hours

## 2019-06-05 MED ORDER — ACETAMINOPHEN 500 MG PO TABS
1000.0000 mg | ORAL_TABLET | Freq: Four times a day (QID) | ORAL | Status: DC | PRN
  Administered 2019-06-05 – 2019-06-06 (×3): 1000 mg via ORAL
  Filled 2019-06-05 (×3): qty 2

## 2019-06-05 NOTE — Clinical Social Work Maternal (Signed)
CLINICAL SOCIAL WORK MATERNAL/CHILD NOTE  Patient Details  Name: Monique Keith MRN: [id number] Date of Birth: Jan 23, 1996  Date:  06/05/2019  Clinical Social Worker Initiating Note:  Abundio Miu, [place] Date/Time: Initiated:  06/05/19/1040     Child's Name:  Roland Rack   Biological Parents:  Mother, Father(Father: Quillian Quince)   Need for Interpreter:  None   Reason for Referral:  Current Substance Use/Substance Use During Pregnancy , Other (Comment)(NICU admission)   Address:  [address] [place] [address]   Physical Address: [place] [address]   Phone number:  [phone] (home)     Additional phone number:   Household Members/Support Persons (HM/SP):   Household Member/Support Person 1   HM/SP Name Relationship DOB or Age  HM/SP -[place] mother    HM/SP -2        HM/SP -3        HM/SP -4        HM/SP -5        HM/SP -6        HM/SP -7        HM/SP -8          Natural Supports (not living in the home):  Immediate Family, Extended Family   Professional Supports: None   Employment: Unemployed   Type of Work:     Education:  Programmer, systems   Homebound arranged:    Museum/gallery curator Resources:  Multimedia programmer   Other Resources:  [employer], Physicist, medical    Cultural/Religious Considerations Which May Impact Care:    Strengths:  Ability to meet basic needs , Engineer, materials, Home prepared for child    Psychotropic Medications:         Pediatrician:    [place]olic[place] area  Pediatrician List:   [facility]      Pediatrician Fax Number:    Risk Factors/Current Problems:  None   Cognitive State:  Able to Concentrate , Alert , Goal Oriented , Insightful , Linear Thinking    Mood/Affect:  Interested , Relaxed , Calm    CSW Assessment: CSW met with MOB at bedside to discuss infant's  NICU admission and substance use during pregnancy, MOB's mother was present. CSW introduced self and explained reason for visit to discuss infant's NICU admission. MOB was welcoming, pleasant and engaged during assessment. MOB's mother was also pleasant during assessment. MOB reported that she resides with her mother and is currently unemployed. MOB reported that she receives both [facility] and food stamps. MOB reported that she has all items needed to care for infant including a car seat, crib and basinet. CSW inquired about MOB's support system, MOB reported that her mom, brother, brother's wife and older cousins were her supports.   CSW and MOB and MOB's mother discussed infant's NICU admission. MOB reported that it has been good but a little confusing. MOB explained that she has not understood all the information provided about infant's care. MOB's mother went into detail about infant's care and questions that they had. CSW acknowledged MOB's experience and explained that the hospital wants to make sure that she fully understands all care provided to the baby. CSW encouraged MOB to ask questions when she is unclear about infant's care and that she can request to speak to a medical provider at any time. CSW  informed MOB that she can also ask the nurse to explain infant's care more thoroughly if needed. MOB agreed and reported that she likes infant's current nurse because she explains things well and provides updates about infant's progress. CSW informed MOB about the NICU, what to expect and resources/supports available while infant is admitted to the NICU. MOB denied any questions/concerns.   CSW asked MOB's mother to leave the room to speak with MOB privately, MOB's mother left voluntarily. CSW inquired about MOB's mental health history, MOB denied any mental health history. CSW asked MOB how she has been feeling emotionally since giving birth. MOB reported that she was fine until they transferred to couplet  care. MOB reported that it was out the blue and that's when everything shifted. MOB explained that she is still trying to adjust from having her baby beside her and holding her whenever she wants to asking someone can she hold her baby. CSW acknowledged, normalized and validated MOB's feelings. CSW informed MOB that the NICU is a journey and can be emotional at times. MOB reported that she is starting to adjust. CSW encouraged MOB to rely on her supports during this hard time and to reach out to CSW if any additional support is needed. MOB presented calm and did not demonstrate any acute mental health signs/symptoms. CSW assessed for safety, MOB denied SI, HI and domestic violence.   CSW provided education regarding the baby blues period vs. perinatal mood disorders, discussed treatment and gave resources for mental health follow up if concerns arise.  CSW recommends self-evaluation during the postpartum time period using the New Mom Checklist from Postpartum Progress and encouraged MOB to contact a medical professional if symptoms are noted at any time.    CSW provided review of Sudden Infant Death Syndrome (SIDS) precautions. MOB verbalized understanding and reported that infant has a crib and basinet.   CSW informed MOB about the hospital drug screen policy due to substance use during pregnancy. MOB verbalized understanding and reported that she last used marijuana January 2020. MOB denied any other substance use during pregnancy. CSW informed MOB that UDS was pending and CDS would continue to be monitored a CPS report would be made if warranted. MOB asked appropriate and relevant questions regarding CPS report. MOB denied any other needs/concerns at this time.   CSW will continue to offer resources/supports while infant is admitted to the NICU.     CSW Plan/Description:  Perinatal Mood and Anxiety Disorder (PMADs) Education, Sudden Infant Death Syndrome (SIDS) Education, [place], CSW Will Continue to Monitor Umbilical Cord Tissue Drug Screen Results and Make Report if [place], Other Patient/Family Education    Burnis Medin, LCSW 06/05/2019, 1:10 PM

## 2019-06-05 NOTE — Progress Notes (Addendum)
Monique ProctorMiyanna Keith [id number] Postpartum Postoperative Day # 2  Monique Keith, G1P0, 5743w1d, S/P Primary LT Cesarean Section due to failure to progress and dillate @ 5-6cm. .   Subjective: Patient up ad lib, denies syncope or dizziness. Reports consuming regular diet without issues and denies N/V. Patient reports 0 bowel movement + passing flatus.  Denies issues with urination and reports bleeding is "lighter."  Patient is bottle feeding and reports not going well. Baby Female was brought to the NICU where mother and baby on are on couplet care due to the baby not feeding for 24 hours.  Desires Depo for postpartum contraception.  Pain is being appropriately managed with use of po meds. Pt endorses feeling fatigue, but denies dizziness, no syncope, no heart palpitations, no SOB or CP. Denies other s/sx of anemia. HGB drop from 10.7-7.6. Reviewed and recommended blood transfusion, R/B/A reviewed, pt declined, recommended iron transfusion, R/B/A reviewed, pt declined, pt wants to take po iron. Noted elevated BP yesterday on flow-sheet right after surgery. Average 140-90s for 4 hours, then returned to baseline of 115/70s, no dx made, no labs drawn, pt currently denies HA, RUQ pain, or vision changes.   Objective: Patient Vitals for the past 24 hrs:  BP Temp Temp src Pulse Resp SpO2  06/05/19 0513 119/75 98.2 F (36.8 C) Oral 63 16 100 %  06/04/19 2343 129/81 98.2 F (36.8 C) Oral 70 16 100 %  06/04/19 2058 129/80 98.5 F (36.9 C) Oral 72 16 100 %  06/04/19 1444 120/78 97.7 F (36.5 C) Oral 62 16 -     Physical Exam:  General: alert, cooperative, appears stated age and no distress Mood/Affect: Flat Lungs: clear to auscultation, no wheezes, rales or rhonchi, symmetric air entry.  Heart: normal rate, regular rhythm, normal S1, S2, no murmurs, rubs, clicks or gallops. Breast: breasts appear normal, no suspicious masses, no skin or nipple changes or axillary nodes. Abdomen:  + bowel sounds, soft,  non-tender Incision: healing well, no significant drainage, no dehiscence, no significant erythema, Honeycomb dressing  Uterine Fundus: firm, involution -1 Lochia: appropriate Skin: Warm, Dry. DVT Evaluation: No evidence of DVT seen on physical exam. Negative Homan's sign. No cords or calf tenderness. No significant calf/ankle edema. 2+ Patellar DTR, No clonus.   Labs: Recent Labs    06/04/19 0537  HGB 7.6*  HCT 23.9*  WBC 18.3*    CBG (last 3)  No results for input(s): GLUCAP in the last 72 hours.   I/O: I/O last 3 completed shifts: In: 5667.9 [P.O.:2302; I.V.:3365.9] Out: 5337 [Urine:4500; Blood:837]   Assessment Postpartum Postoperative Day # 2. Monique ProctorMiyanna Suydam, G1P0, 2443w1d, S/P Primary LT Cesarean Section due to failure to dilate and progress with IOL for SGA per H&P.  Pt stable. -1 Involution. Bottle Feeding. Hemodynamically Stable, +fatigue, -other s/sx for anemia, hgb 10.7-7.6 post op, QBL was 800mls, pt declined blood transfusion and iron transfusion. Pt informed I recommended one or the other, pt stated she would think about it if anything changed.  Baby Female: In isolette for no/poor feeding for >24hours, pt now being placed on abx for sepsis r/u. Per H7P mother smoked 0.25pack daily and quiet with +UPT, also used marijuana in past, no drug screen taken upon admission.   Plan: Continue other mgmt as ordered VTE Prophylactics: SCD, ambulated as tolerates.  Pain control: Motrin/Tylenol/Narcotics PRN Education given regarding options for contraception, including barrier methods, injectable contraception, IUD placement, oral contraceptives.  Plan for discharge tomorrow and Contraception Depo: ordered.  Dr. Alesia Richards to be updated on patient status @ 0700 when she assumes care of pt.   [facility] NP-C, CNM 06/05/2019, 01:12 AM  Attestation of Attending Supervision of Advanced Practitioner (CNM/NP): Evaluation and management procedures were performed by the Advanced  Practitioner under my supervision and collaboration.  I have reviewed the Advanced Practitioner's note and chart, and I agree with the management and plan.  I saw and examined patient at bedside and agree with above findings, assessment and plan as outlined above by [facility].  Patient feels like she is going to fall down when she gets up,has been able to ambulate though.  Discussed blood or iron transfusion, she continues to decline at this time.  RN to check orthostatics on patient.  Dr. Alesia Richards.  06/05/2019 1820.

## 2019-06-06 MED ORDER — MEDROXYPROGESTERONE ACETATE 150 MG/ML IM SUSP
150.0000 mg | Freq: Once | INTRAMUSCULAR | Status: AC
  Administered 2019-06-06: 150 mg via INTRAMUSCULAR
  Filled 2019-06-06: qty 1

## 2019-06-06 MED ORDER — FERROUS SULFATE 325 (65 FE) MG PO TABS: 325.0000 mg | ORAL_TABLET | Freq: Two times a day (BID) | ORAL | 3 refills | Status: DC

## 2019-06-06 MED ORDER — IBUPROFEN 600 MG PO TABS: 600.0000 mg | ORAL_TABLET | Freq: Four times a day (QID) | ORAL | 0 refills | Status: DC | PRN

## 2019-06-06 MED ORDER — ACETAMINOPHEN 500 MG PO TABS: 1000.0000 mg | ORAL_TABLET | Freq: Four times a day (QID) | ORAL | 0 refills | Status: DC | PRN

## 2019-06-06 NOTE — Discharge Summary (Signed)
OB Discharge Summary     Patient Name: Monique Keith DOB: 06/28/1996 MRN: [id number]  Date of admission: 06/01/2019 Delivering MD: Gerald LeitzOLE, TARA   Date of discharge: 06/06/2019  Admitting diagnosis: pregnancy Intrauterine pregnancy: 4968w2d     Secondary diagnosis:  Principal Problem:   Postpartum care following cesarean delivery 9/15 Active Problems:   Encounter for induction of labor   IUGR (intrauterine growth restriction) affecting care of mother   Cesarean delivery delivered - arrest of dilation 5 cm  Additional problems: Blood loss anemia     Discharge diagnosis: Term Pregnancy Delivered                                                                                                Post partum procedures:iron  Augmentation: AROM and Pitocin  Complications: None  Hospital course:  Induction of Labor With Cesarean Section  23 y.o. yo G1P0 at 3968w2d was admitted to the hospital 06/01/2019 for induction of labor. Patient had a labor course significant for failure to progress. The patient went for cesarean section due to Arrest of Dilation, and delivered a Viable infant,06/03/2019  Membrane Rupture Time/Date: 12:55 PM ,06/02/2019   Details of operation can be found in separate operative Note.  Patient had an complicated postpartum course due to low hgb. Declined iron transfusion and blood transfusion.  Taking oral iron and feeling better on third day.. She is ambulating, tolerating a regular diet, passing flatus, and urinating well.  Patient is discharged home in stable condition on 06/06/19.                                    Physical exam  Vitals:   06/05/19 1700 06/05/19 2000 06/05/19 2347 06/06/19 0400  BP: 118/77 (!) 131/95 (!) 127/93 127/86  Pulse: 73 67 76 80  Resp: 16 18 18 18   Temp: 98.2 F (36.8 C) 98.1 F (36.7 C) 98.1 F (36.7 C) 98.1 F (36.7 C)  TempSrc: Oral Oral Oral Oral  SpO2: 98% 100% 99% 99%  Weight:      Height:       General: alert, cooperative and no  distress Lochia: appropriate Uterine Fundus: firm Incision: Dressing is clean, dry, and intact DVT Evaluation: No evidence of DVT seen on physical exam. Labs: Lab Results  Component Value Date   WBC 18.3 (H) 06/04/2019   HGB 7.6 (L) 06/04/2019   HCT 23.9 (L) 06/04/2019   MCV 81.6 06/04/2019   PLT 200 06/04/2019   CMP Latest Ref Rng & Units 11/01/2016  Glucose 65 - 99 mg/dL 80  BUN 6 - 20 mg/dL 7  Creatinine 4.090.44 - 8.111.00 mg/dL 9.140.80  Sodium 782135 - 956145 mmol/L 141  Potassium 3.5 - 5.1 mmol/L 3.5  Chloride 101 - 111 mmol/L 108  CO2 22 - 32 mmol/L -  Calcium 8.9 - 10.3 mg/dL -  Total Protein 6.5 - 8.1 g/dL -  Total Bilirubin 0.3 - 1.2 mg/dL -  Alkaline Phos 38 - 213126 U/L -  AST 15 - 41 U/L -  ALT 14 - 54 U/L -    Discharge instruction: per After Visit Summary and "Baby and Me Booklet".  After visit meds:  Allergies as of 06/06/2019   No Known Allergies     Medication List    STOP taking these medications   Comfort Fit Maternity Supp Med Misc   cyclobenzaprine 10 MG tablet Commonly known as: FLEXERIL   ondansetron 8 MG disintegrating tablet Commonly known as: Zofran ODT     TAKE these medications   acetaminophen 500 MG tablet Commonly known as: TYLENOL Take 2 tablets (1,000 mg total) by mouth every 6 (six) hours as needed for moderate pain.   ferrous sulfate 325 (65 FE) MG tablet Take 1 tablet (325 mg total) by mouth 2 (two) times daily with a meal.   ibuprofen 600 MG tablet Commonly known as: ADVIL Take 1 tablet (600 mg total) by mouth every 6 (six) hours as needed for cramping.   PrePLUS 27-1 MG Tabs Take 1 tablet by mouth daily.       Diet: routine diet  Activity: Advance as tolerated. Pelvic rest for 6 weeks.   Outpatient follow up:6 weeks Follow up Appt:No future appointments. Follow up Visit:No follow-ups on file.  Postpartum contraception: Depo Provera  Newborn Data: Live born female  Birth Weight: 6 lb 1.2 oz (2755 g) APGAR: 8, 9  Newborn  Delivery   Birth date/time: 06/03/2019 07:14:00 Delivery type: C-Section, Low Transverse Trial of labor: No C-section categorization: Primary      Baby Feeding: Bottle Disposition:home with mother   06/06/2019 Starla Link, CNM

## 2019-07-11 DIAGNOSIS — Z308 Encounter for other contraceptive management: Secondary | ICD-10-CM | POA: Diagnosis not present

## 2019-09-07 ENCOUNTER — Other Ambulatory Visit: Payer: Self-pay

## 2019-09-07 ENCOUNTER — Emergency Department (HOSPITAL_COMMUNITY)
Admission: EM | Admit: 2019-09-07 | Discharge: 2019-09-07 | Disposition: A | Discharge status: Home / Self Care | Payer: BC Managed Care – PPO | Attending: Emergency Medicine | Admitting: Emergency Medicine

## 2019-09-07 ENCOUNTER — Emergency Department (HOSPITAL_COMMUNITY): Payer: BC Managed Care – PPO

## 2019-09-07 ENCOUNTER — Encounter (HOSPITAL_COMMUNITY): Payer: Self-pay | Admitting: Emergency Medicine

## 2019-09-07 DIAGNOSIS — R0602 Shortness of breath: Secondary | ICD-10-CM | POA: Diagnosis present

## 2019-09-07 DIAGNOSIS — J069 Acute upper respiratory infection, unspecified: Secondary | ICD-10-CM | POA: Insufficient documentation

## 2019-09-07 DIAGNOSIS — Z20828 Contact with and (suspected) exposure to other viral communicable diseases: Secondary | ICD-10-CM | POA: Insufficient documentation

## 2019-09-07 DIAGNOSIS — Z87891 Personal history of nicotine dependence: Secondary | ICD-10-CM | POA: Diagnosis not present

## 2019-09-07 DIAGNOSIS — R0789 Other chest pain: Secondary | ICD-10-CM | POA: Diagnosis not present

## 2019-09-07 DIAGNOSIS — R05 Cough: Secondary | ICD-10-CM | POA: Insufficient documentation

## 2019-09-07 LAB — INFLUENZA PANEL BY PCR (TYPE A & B)
Influenza A By PCR: NEGATIVE
Influenza B By PCR: NEGATIVE

## 2019-09-07 MED ORDER — GUAIFENESIN ER 1200 MG PO TB12: 1.0000 | ORAL_TABLET | Freq: Two times a day (BID) | ORAL | 0 refills | Status: DC

## 2019-09-07 MED ORDER — PROMETHAZINE-DM 6.25-15 MG/5ML PO SYRP: 5.0000 mL | ORAL_SOLUTION | Freq: Four times a day (QID) | ORAL | 0 refills | Status: DC | PRN

## 2019-09-07 NOTE — ED Notes (Signed)
Pt discharge instructions and prescriptions reviewed with the patient. The patient verbalized understanding of both. Pt discharged. 

## 2019-09-07 NOTE — ED Triage Notes (Signed)
C/o runny nose, SOB, non-productive cough, headache, and pain to center of chest x 3 days.  Denies fever.

## 2019-09-07 NOTE — Discharge Instructions (Signed)
You will need to isolate until the results of your test come back.  Your chest x-ray did not show any abnormalities.  Take Tylenol for any fevers.

## 2019-09-08 LAB — NOVEL CORONAVIRUS, NAA (HOSP ORDER, SEND-OUT TO REF LAB; TAT 18-24 HRS): SARS-CoV-2, NAA: NOT DETECTED

## 2019-09-09 NOTE — ED Provider Notes (Signed)
[facility] EMERGENCY DEPARTMENT Provider Note   CSN: [id number] Arrival date & time: 09/07/19  5573     History Chief Complaint  Patient presents with  . ? COVID  . Chest Pain  . Shortness of Breath  . Cough    Monique Keith is a 23 y.o. female.  HPI Patient presents to the emergency department with runny nose, shortness of breath, cough and headache that started 3 days ago.  The patient states she said no fevers.  Patient states she did not take any medications prior to arrival for symptoms.  Patient states that she is not better anyone that sick that she knows of.  The patient denies chest pain, shortness of breath, headache,blurred vision, neck pain, fever,  weakness, numbness, dizziness, anorexia, edema, abdominal pain, nausea, vomiting, diarrhea, rash, back pain, dysuria, hematemesis, bloody stool, near syncope, or syncope.    Past Medical History:  Diagnosis Date  . Chlamydia   . Gonorrhea   . Hypertension   . Hypoglycemia     Patient Active Problem List   Diagnosis Date Noted  . Cesarean delivery delivered - arrest of dilation 5 cm 06/03/2019  . Postpartum care following cesarean delivery 9/15 06/03/2019  . Encounter for induction of labor 06/01/2019  . IUGR (intrauterine growth restriction) affecting care of mother 06/01/2019    Past Surgical History:  Procedure Laterality Date  . CESAREAN SECTION N/A 06/03/2019   Procedure: CESAREAN SECTION;  Surgeon: Gerald Leitz, MD;  Location: [facility] LD ORS;  Service: Obstetrics;  Laterality: N/A;  . HERNIA REPAIR    . UMBILICAL HERNIA REPAIR       OB History    Gravida  1   Para      Term      Preterm      AB      Living        SAB      TAB      Ectopic      Multiple      Live Births              Family History  Problem Relation Age of Onset  . Hypertension Maternal Grandmother   . Hypertension Maternal Grandfather   . Hyperlipidemia Maternal Grandfather   . Diabetes Maternal  Grandfather     Social History   Tobacco Use  . Smoking status: Former Smoker    Packs/day: 0.25    Quit date: 10/08/2018    Years since quitting: 0.9  . Smokeless tobacco: Never Used  Substance Use Topics  . Alcohol use: No    Alcohol/week: 0.0 standard drinks  . Drug use: Not Currently    Types: Marijuana    Comment: last use 24 Jan 20    Home Medications Prior to Admission medications   Medication Sig Start Date End Date Taking? Authorizing Provider  acetaminophen (TYLENOL) 500 MG tablet Take 2 tablets (1,000 mg total) by mouth every 6 (six) hours as needed for moderate pain. 06/06/19   Prothero, Henderson Newcomer, CNM  ferrous sulfate 325 (65 FE) MG tablet Take 1 tablet (325 mg total) by mouth 2 (two) times daily with a meal. 06/06/19   Prothero, Henderson Newcomer, CNM  Guaifenesin 1200 MG TB12 Take 1 tablet (1,200 mg total) by mouth 2 (two) times daily. 09/07/19   Toretto Tingler, Cristal Deer, PA-C  ibuprofen (ADVIL) 600 MG tablet Take 1 tablet (600 mg total) by mouth every 6 (six) hours as needed for cramping. 06/06/19   Prothero,  Pleas Koch, CNM  Prenatal Vit-Fe Fumarate-FA (PREPLUS) 27-1 MG TABS Take 1 tablet by mouth daily. 10/11/18   Darlina Rumpf, CNM  promethazine-dextromethorphan (PROMETHAZINE-DM) 6.25-15 MG/5ML syrup Take 5 mLs by mouth 4 (four) times daily as needed for cough. 09/07/19   Dalia Heading, PA-C    Allergies    Patient has no known allergies.  Review of Systems   Review of Systems All other systems negative except as documented in the HPI. All pertinent positives and negatives as reviewed in the HPI. Physical Exam Updated Vital Signs BP (!) 134/98 (BP Location: Right Arm)   Pulse 78   Temp 99.4 F (37.4 C) (Oral)   Resp 16   Ht 5\' 3"  (1.6 m)   Wt 63.5 kg   LMP 08/31/2019   SpO2 99%   BMI 24.80 kg/m   Physical Exam Vitals and nursing note reviewed.  Constitutional:      General: She is not in acute distress.    Appearance: She is well-developed.    HENT:     Head: Normocephalic and atraumatic.  Eyes:     Pupils: Pupils are equal, round, and reactive to light.  Cardiovascular:     Rate and Rhythm: Normal rate and regular rhythm.     Heart sounds: Normal heart sounds. No murmur. No friction rub. No gallop.   Pulmonary:     Effort: Pulmonary effort is normal. No respiratory distress.     Breath sounds: Normal breath sounds. No wheezing.  Abdominal:     General: Bowel sounds are normal. There is no distension.     Palpations: Abdomen is soft.     Tenderness: There is no abdominal tenderness.  Musculoskeletal:     Cervical back: Normal range of motion and neck supple.  Skin:    General: Skin is warm and dry.     Capillary Refill: Capillary refill takes less than 2 seconds.     Findings: No erythema or rash.  Neurological:     Mental Status: She is alert and oriented to person, place, and time.     Motor: No abnormal muscle tone.     Coordination: Coordination normal.  Psychiatric:        Behavior: Behavior normal.     ED Results / Procedures / Treatments   Labs (all labs ordered are listed, but only abnormal results are displayed) Labs Reviewed  NOVEL CORONAVIRUS, NAA (HOSP ORDER, SEND-OUT TO REF LAB; TAT 18-24 HRS)  INFLUENZA PANEL BY PCR (TYPE A & B)    EKG EKG Interpretation  Date/Time:  Sunday September 07 2019 09:57:42 EST Ventricular Rate:  78 PR Interval:  130 QRS Duration: 72 QT Interval:  378 QTC Calculation: 430 R Axis:   81 Text Interpretation: Normal sinus rhythm with sinus arrhythmia Nonspecific T wave abnormality Abnormal ECG no prior available for comparison Confirmed by Quintella Reichert [phone]) on 09/07/2019 10:47:17 AM   Radiology No results found.  Procedures Procedures (including critical care time)  Medications Ordered in ED Medications - No data to display  ED Course  I have reviewed the triage vital signs and the nursing notes.  Pertinent labs & imaging results that were available  during my care of the patient were reviewed by me and considered in my medical decision making (see chart for details).    MDM Rules/Calculators/A&P                      Patient is advised to quarantine until results  are back.  Have advised patient to increase her fluid intake and rest as much as possible.  Told to take Tylenol for any symptoms such as fever or headache. Final Clinical Impression(s) / ED Diagnoses Final diagnoses:  Upper respiratory tract infection, unspecified type    Rx / DC Orders ED Discharge Orders         Ordered    promethazine-dextromethorphan (PROMETHAZINE-DM) 6.25-15 MG/5ML syrup  4 times daily PRN     09/07/19 1229    Guaifenesin 1200 MG TB12  2 times daily     09/07/19 [address]           Kailin Principato, PA-C 09/09/19 1619    Tilden Fossaees, Elizabeth, MD 09/10/19 [phone]

## 2020-01-06 ENCOUNTER — Other Ambulatory Visit: Payer: Self-pay

## 2020-01-06 ENCOUNTER — Ambulatory Visit (HOSPITAL_COMMUNITY)
Admission: EM | Admit: 2020-01-06 | Discharge: 2020-01-06 | Disposition: A | Discharge status: Home / Self Care | Payer: BC Managed Care – PPO | Attending: Family Medicine | Admitting: Family Medicine

## 2020-01-06 ENCOUNTER — Encounter (HOSPITAL_COMMUNITY): Payer: Self-pay

## 2020-01-06 DIAGNOSIS — Z8249 Family history of ischemic heart disease and other diseases of the circulatory system: Secondary | ICD-10-CM | POA: Insufficient documentation

## 2020-01-06 DIAGNOSIS — Z8349 Family history of other endocrine, nutritional and metabolic diseases: Secondary | ICD-10-CM | POA: Diagnosis not present

## 2020-01-06 DIAGNOSIS — R05 Cough: Secondary | ICD-10-CM | POA: Diagnosis not present

## 2020-01-06 DIAGNOSIS — Z87891 Personal history of nicotine dependence: Secondary | ICD-10-CM | POA: Diagnosis not present

## 2020-01-06 DIAGNOSIS — Z833 Family history of diabetes mellitus: Secondary | ICD-10-CM | POA: Diagnosis not present

## 2020-01-06 DIAGNOSIS — R059 Cough, unspecified: Secondary | ICD-10-CM

## 2020-01-06 DIAGNOSIS — J029 Acute pharyngitis, unspecified: Secondary | ICD-10-CM | POA: Diagnosis not present

## 2020-01-06 DIAGNOSIS — Z20822 Contact with and (suspected) exposure to covid-19: Secondary | ICD-10-CM | POA: Insufficient documentation

## 2020-01-06 MED ORDER — BENZONATATE 100 MG PO CAPS: ORAL_CAPSULE | ORAL | 0 refills | Status: DC

## 2020-01-06 NOTE — Discharge Instructions (Addendum)
You have been tested for COVID-19 today. °If your test returns positive, you will receive a phone call from [facility] regarding your results. °Negative test results are not called. °Both positive and negative results area always visible on MyChart. °If you do not have a MyChart account, sign up instructions are provided in your discharge papers. °Please do not hesitate to contact us should you have questions or concerns. ° °

## 2020-01-06 NOTE — ED Triage Notes (Signed)
Pt c/o non productive cough, chest congestion, sore throat from coughing since yesterday. Pt c/o SOB. Pt has non labored breathing. Skin color WNL.

## 2020-01-08 LAB — SARS CORONAVIRUS 2 (TAT 6-24 HRS): SARS Coronavirus 2: NEGATIVE
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
## 2020-06-12 ENCOUNTER — Emergency Department (HOSPITAL_COMMUNITY)
Admission: EM | Admit: 2020-06-12 | Discharge: 2020-06-13 | Disposition: A | Discharge status: Home / Self Care | Payer: BC Managed Care – PPO | Attending: Emergency Medicine | Admitting: Emergency Medicine

## 2020-06-12 ENCOUNTER — Encounter (HOSPITAL_COMMUNITY): Payer: Self-pay

## 2020-06-12 ENCOUNTER — Other Ambulatory Visit: Payer: Self-pay

## 2020-06-12 DIAGNOSIS — Z20822 Contact with and (suspected) exposure to covid-19: Secondary | ICD-10-CM | POA: Insufficient documentation

## 2020-06-12 DIAGNOSIS — Z5321 Procedure and treatment not carried out due to patient leaving prior to being seen by health care provider: Secondary | ICD-10-CM | POA: Diagnosis not present

## 2020-06-12 DIAGNOSIS — R112 Nausea with vomiting, unspecified: Secondary | ICD-10-CM | POA: Diagnosis present

## 2020-06-12 DIAGNOSIS — R519 Headache, unspecified: Secondary | ICD-10-CM | POA: Insufficient documentation

## 2020-06-12 MED ORDER — ONDANSETRON 4 MG PO TBDP
4.0000 mg | ORAL_TABLET | Freq: Once | ORAL | Status: AC | PRN
  Administered 2020-06-12: 4 mg via ORAL
  Filled 2020-06-12: qty 1

## 2020-06-12 NOTE — ED Triage Notes (Signed)
Pt had one emesis occurrence in peds ED. Sts she also has nausea/HA. Denies fever. Denies stomach pain.

## 2020-06-13 LAB — RESPIRATORY PANEL BY RT PCR (FLU A&B, COVID)
Influenza A by PCR: NEGATIVE
Influenza B by PCR: NEGATIVE
SARS Coronavirus 2 by RT PCR: NEGATIVE

## 2020-06-13 NOTE — ED Notes (Addendum)
Pt is on peds side with child

## 2020-06-13 NOTE — ED Notes (Addendum)
Patient does not want to be seen. PEDS ED called PEDS floor, where patient is with daughter, to verify.

## 2021-02-16 ENCOUNTER — Encounter (HOSPITAL_BASED_OUTPATIENT_CLINIC_OR_DEPARTMENT_OTHER): Payer: Self-pay | Admitting: *Deleted

## 2021-02-16 ENCOUNTER — Other Ambulatory Visit: Payer: Self-pay

## 2021-02-16 ENCOUNTER — Emergency Department (HOSPITAL_BASED_OUTPATIENT_CLINIC_OR_DEPARTMENT_OTHER): Payer: BC Managed Care – PPO | Admitting: Radiology

## 2021-02-16 ENCOUNTER — Emergency Department (HOSPITAL_BASED_OUTPATIENT_CLINIC_OR_DEPARTMENT_OTHER)
Admission: EM | Admit: 2021-02-16 | Discharge: 2021-02-16 | Disposition: A | Discharge status: Home / Self Care | Payer: BC Managed Care – PPO | Attending: Emergency Medicine | Admitting: Emergency Medicine

## 2021-02-16 DIAGNOSIS — Z87891 Personal history of nicotine dependence: Secondary | ICD-10-CM | POA: Diagnosis not present

## 2021-02-16 DIAGNOSIS — I10 Essential (primary) hypertension: Secondary | ICD-10-CM | POA: Diagnosis not present

## 2021-02-16 DIAGNOSIS — R0789 Other chest pain: Secondary | ICD-10-CM

## 2021-02-16 DIAGNOSIS — R079 Chest pain, unspecified: Secondary | ICD-10-CM | POA: Diagnosis present

## 2021-02-16 HISTORY — DX: Anemia, unspecified: D64.9

## 2021-02-16 LAB — CBC
HCT: 38.9 % (ref 36.0–46.0)
Hemoglobin: 12.5 g/dL (ref 12.0–15.0)
MCH: 27.2 pg (ref 26.0–34.0)
MCHC: 32.1 g/dL (ref 30.0–36.0)
MCV: 84.7 fL (ref 80.0–100.0)
Platelets: 360 10*3/uL (ref 150–400)
RBC: 4.59 MIL/uL (ref 3.87–5.11)
RDW: 14.2 % (ref 11.5–15.5)
WBC: 5.6 10*3/uL (ref 4.0–10.5)
nRBC: 0 % (ref 0.0–0.2)

## 2021-02-16 LAB — HCG, SERUM, QUALITATIVE: Preg, Serum: NEGATIVE

## 2021-02-16 LAB — TROPONIN I (HIGH SENSITIVITY)
Troponin I (High Sensitivity): <2 ng/L (ref <18)
Troponin I (High Sensitivity): <2 ng/L (ref <18)

## 2021-02-16 LAB — BASIC METABOLIC PANEL
Anion gap: 9 (ref 5–15)
BUN: 10 mg/dL (ref 6–20)
CO2: 26 mmol/L (ref 22–32)
Calcium: 9.6 mg/dL (ref 8.9–10.3)
Chloride: 105 mmol/L (ref 98–111)
Creatinine, Ser: 0.8 mg/dL (ref 0.44–1.00)
GFR, Estimated: >60 mL/min (ref >60)
Glucose, Bld: 75 mg/dL (ref 70–99)
Potassium: 3.6 mmol/L (ref 3.5–5.1)
Sodium: 140 mmol/L (ref 135–145)

## 2021-02-16 LAB — D-DIMER, QUANTITATIVE: D-Dimer, Quant: 0.38 ug/mL-FEU (ref 0.00–0.50)

## 2021-02-16 NOTE — ED Provider Notes (Signed)
[facility] EMERGENCY DEPT Provider Note   CSN: [id number] Arrival date & time: 02/16/21  1032     History Chief Complaint  Patient presents with  . Hypertension  . Chest Pain    Monique Keith is a 25 y.o. female.  25 year old female with history of hypertension who presents with chest pain.  This morning while laying in bed, patient suddenly began having intermittent, sharp, left-sided chest pain that initially took her breath away.  Pain has been intermittent since it began and not associated with exertion.  She reports associated shortness of breath and it hurts when she takes a deep breath then.  Pain 6/10 in intensity.  She reports associated nausea, no vomiting, fevers, URI symptoms, leg swelling, or recent illness.  No recent travel, estrogen use, or history of blood clots. Denies tobacco, alcohol, or drug use. She reported "MI" in high school, however upon further questioning she notes she had similar chest pain in high school as well as problems w/ syncope for which she had to f/u with pediatric cardiologist. She was diagnosed w/ HTN and prescribed verapamil but she has not been compliant with his medication for at least a year.  The history is provided by the patient.  Hypertension Associated symptoms include chest pain.  Chest Pain      Past Medical History:  Diagnosis Date  . Anemia   . Chlamydia   . Gonorrhea   . Hypertension   . Hypoglycemia     Patient Active Problem List   Diagnosis Date Noted  . Cesarean delivery delivered - arrest of dilation 5 cm 06/03/2019  . Postpartum care following cesarean delivery 9/15 06/03/2019  . Encounter for induction of labor 06/01/2019  . IUGR (intrauterine growth restriction) affecting care of mother 06/01/2019    Past Surgical History:  Procedure Laterality Date  . CESAREAN SECTION N/A 06/03/2019   Procedure: CESAREAN SECTION;  Surgeon: Gerald Leitz, MD;  Location: [facility] LD ORS;  Service: Obstetrics;  Laterality:  N/A;  . HERNIA REPAIR    . UMBILICAL HERNIA REPAIR       OB History    Gravida  1   Para      Term      Preterm      AB      Living        SAB      IAB      Ectopic      Multiple      Live Births              Family History  Problem Relation Age of Onset  . Hypertension Maternal Grandmother   . Hypertension Maternal Grandfather   . Hyperlipidemia Maternal Grandfather   . Diabetes Maternal Grandfather     Social History   Tobacco Use  . Smoking status: Former Smoker    Packs/day: 0.25    Quit date: 10/08/2018    Years since quitting: 2.3  . Smokeless tobacco: Never Used  Vaping Use  . Vaping Use: Never used  Substance Use Topics  . Alcohol use: No    Alcohol/week: 0.0 standard drinks  . Drug use: Not Currently    Types: Marijuana    Comment: last use 24 Jan 20    Home Medications Prior to Admission medications   Medication Sig Start Date End Date Taking? Authorizing Provider  acetaminophen (TYLENOL) 500 MG tablet Take 2 tablets (1,000 mg total) by mouth every 6 (six) hours as needed for moderate pain. 06/06/19  Prothero, Henderson Newcomer, CNM  benzonatate (TESSALON) 100 MG capsule Take 1 capsule by mouth every 8 (eight) hours for cough. 01/06/20   Mardella Layman, MD  ferrous sulfate 325 (65 FE) MG tablet Take 1 tablet (325 mg total) by mouth 2 (two) times daily with a meal. 06/06/19   Prothero, Henderson Newcomer, CNM    Allergies    Patient has no known allergies.  Review of Systems   Review of Systems  Cardiovascular: Positive for chest pain.   All other systems reviewed and are negative except that which was mentioned in HPI  Physical Exam Updated Vital Signs BP (!) 136/95 (BP Location: Right Arm)   Pulse 83   Temp 98.2 F (36.8 C) (Oral)   Resp 17   Ht 5\' 3"  (1.6 m)   Wt 63.5 kg   SpO2 100%   BMI 24.80 kg/m   Physical Exam Vitals and nursing note reviewed.  Constitutional:      General: She is not in acute distress.    Appearance: Normal  appearance. She is well-developed.  HENT:     Head: Normocephalic and atraumatic.  Eyes:     Conjunctiva/sclera: Conjunctivae normal.  Cardiovascular:     Rate and Rhythm: Normal rate and regular rhythm.     Heart sounds: Normal heart sounds. No murmur heard.   Pulmonary:     Effort: Pulmonary effort is normal.     Breath sounds: Normal breath sounds.  Abdominal:     General: Abdomen is flat. Bowel sounds are normal. There is no distension.     Palpations: Abdomen is soft.     Tenderness: There is no abdominal tenderness.  Musculoskeletal:     Right lower leg: No edema.     Left lower leg: No edema.  Skin:    General: Skin is warm and dry.  Neurological:     Mental Status: She is alert and oriented to person, place, and time.     Comments: fluent  Psychiatric:        Mood and Affect: Mood normal.        Behavior: Behavior normal.     ED Results / Procedures / Treatments   Labs (all labs ordered are listed, but only abnormal results are displayed) Labs Reviewed  BASIC METABOLIC PANEL  CBC  HCG, SERUM, QUALITATIVE  D-DIMER, QUANTITATIVE  TROPONIN I (HIGH SENSITIVITY)  TROPONIN I (HIGH SENSITIVITY)    EKG EKG Interpretation  Date/Time:  Wednesday February 16 2021 11:27:57 EDT Ventricular Rate:  59 PR Interval:  138 QRS Duration: 83 QT Interval:  536 QTC Calculation: 532 R Axis:   70 Text Interpretation: Sinus rhythm Borderline T abnormalities, anterior leads Prolonged QT interval No significant change since last tracing Confirmed by [birth year] [phone]) on 02/16/2021 12:05:15 PM   Radiology DG Chest 2 View  Result Date: 02/16/2021 CLINICAL DATA:  Chest pain and hypertension EXAM: CHEST - 2 VIEW COMPARISON:  September 07, 2019 FINDINGS: Lungs are clear. Heart size and pulmonary vascularity are normal. No adenopathy. No pneumothorax. There is slight lower thoracic levoscoliosis. IMPRESSION: Lungs clear.  Heart size normal. Electronically Signed   By: September 09, 2019  III M.D.   On: 02/16/2021 11:19    Procedures Procedures   Medications Ordered in ED Medications - No data to display  ED Course  I have reviewed the triage vital signs and the nursing notes.  Pertinent labs & imaging results that were available during my care of the patient were reviewed by  me and considered in my medical decision making (see chart for details).    MDM Rules/Calculators/A&P                          Comfortable on exam, reassuring vital signs. EKG similar to previous in 2020.  Chart review shows that she had extensive cardiac work-up with pediatric cardiology in 2014 for chest pain, hypertension, and syncopal episodes.  At that time she had a normal echo and chest pain was thought to be noncardiac.    Lab work shows negative serial troponins, reassuring BMP and CBC, negative D-dimer making PE very unlikely.  Patient is comfortable and well-appearing on reassessment.  Her description is not suggestive of ACS or other life-threatening cardiac process.  Chest x-ray is clear.  Discussed supportive measures for symptoms.  Recommended PCP follow-up regarding her blood pressure and counseled the patient on the importance of long-term blood pressure management to prevent vascular disease.  Reviewed return precautions.  She voiced understanding. Final Clinical Impression(s) / ED Diagnoses Final diagnoses:  Atypical chest pain  Primary hypertension    Rx / DC Orders ED Discharge Orders    None       Guiseppe Flanagan, Ambrose Finland, MD 02/16/21 1506

## 2021-02-16 NOTE — ED Triage Notes (Addendum)
Pt states she was resting on her left side this morning, had sharp pain that took her breath away, reports mother told her it may be a small heart attack. Pt states Dx with MI when she was in [employer]. 6/10 left upper chest pain.States it hurts to breath.

## 2021-02-16 NOTE — ED Notes (Signed)
Pt ambulated to restroom. Pt aware we are waiting for lab results.

## 2021-02-28 ENCOUNTER — Encounter (HOSPITAL_BASED_OUTPATIENT_CLINIC_OR_DEPARTMENT_OTHER): Payer: Self-pay | Admitting: *Deleted

## 2021-02-28 ENCOUNTER — Emergency Department (HOSPITAL_BASED_OUTPATIENT_CLINIC_OR_DEPARTMENT_OTHER)
Admission: EM | Admit: 2021-02-28 | Discharge: 2021-02-28 | Disposition: A | Discharge status: Home / Self Care | Payer: BC Managed Care – PPO | Attending: Emergency Medicine | Admitting: Emergency Medicine

## 2021-02-28 ENCOUNTER — Other Ambulatory Visit: Payer: Self-pay

## 2021-02-28 DIAGNOSIS — Z5321 Procedure and treatment not carried out due to patient leaving prior to being seen by health care provider: Secondary | ICD-10-CM | POA: Insufficient documentation

## 2021-02-28 DIAGNOSIS — L292 Pruritus vulvae: Secondary | ICD-10-CM | POA: Insufficient documentation

## 2021-02-28 LAB — URINALYSIS, ROUTINE W REFLEX MICROSCOPIC
Bilirubin Urine: NEGATIVE
Glucose, UA: NEGATIVE mg/dL
Ketones, ur: NEGATIVE mg/dL
Nitrite: NEGATIVE
Specific Gravity, Urine: 1.022 (ref 1.005–1.030)
pH: 5.5 (ref 5.0–8.0)

## 2021-02-28 LAB — PREGNANCY, URINE: Preg Test, Ur: NEGATIVE

## 2021-02-28 NOTE — ED Triage Notes (Signed)
Pt states she has irritation in her private area, vaginal itching, no discharge, has had unprotective intercourse.

## 2021-04-23 ENCOUNTER — Emergency Department (HOSPITAL_COMMUNITY)
Admission: EM | Admit: 2021-04-23 | Discharge: 2021-04-23 | Disposition: A | Discharge status: Home / Self Care | Payer: BC Managed Care – PPO | Attending: Emergency Medicine | Admitting: Emergency Medicine

## 2021-04-23 ENCOUNTER — Other Ambulatory Visit: Payer: Self-pay

## 2021-04-23 ENCOUNTER — Encounter (HOSPITAL_COMMUNITY): Payer: Self-pay

## 2021-04-23 DIAGNOSIS — R112 Nausea with vomiting, unspecified: Secondary | ICD-10-CM | POA: Insufficient documentation

## 2021-04-23 DIAGNOSIS — I10 Essential (primary) hypertension: Secondary | ICD-10-CM | POA: Insufficient documentation

## 2021-04-23 DIAGNOSIS — Y900 Blood alcohol level of less than 20 mg/100 ml: Secondary | ICD-10-CM | POA: Insufficient documentation

## 2021-04-23 DIAGNOSIS — F1010 Alcohol abuse, uncomplicated: Secondary | ICD-10-CM | POA: Insufficient documentation

## 2021-04-23 DIAGNOSIS — Z87891 Personal history of nicotine dependence: Secondary | ICD-10-CM | POA: Insufficient documentation

## 2021-04-23 LAB — COMPREHENSIVE METABOLIC PANEL
ALT: 13 U/L (ref 0–44)
AST: 23 U/L (ref 15–41)
Albumin: 4.7 g/dL (ref 3.5–5.0)
Alkaline Phosphatase: 56 U/L (ref 38–126)
Anion gap: 15 (ref 5–15)
BUN: 11 mg/dL (ref 6–20)
CO2: 17 mmol/L — ABNORMAL LOW (ref 22–32)
Calcium: 10.1 mg/dL (ref 8.9–10.3)
Chloride: 104 mmol/L (ref 98–111)
Creatinine, Ser: 0.9 mg/dL (ref 0.44–1.00)
GFR, Estimated: >60 mL/min (ref >60)
Glucose, Bld: 84 mg/dL (ref 70–99)
Potassium: 3.6 mmol/L (ref 3.5–5.1)
Sodium: 136 mmol/L (ref 135–145)
Total Bilirubin: 0.8 mg/dL (ref 0.3–1.2)
Total Protein: 8.6 g/dL — ABNORMAL HIGH (ref 6.5–8.1)

## 2021-04-23 LAB — CBC WITH DIFFERENTIAL/PLATELET
Abs Immature Granulocytes: 0.04 10*3/uL (ref 0.00–0.07)
Basophils Absolute: 0 10*3/uL (ref 0.0–0.1)
Basophils Relative: 0 %
Eosinophils Absolute: 0 10*3/uL (ref 0.0–0.5)
Eosinophils Relative: 0 %
HCT: 41.5 % (ref 36.0–46.0)
Hemoglobin: 13.5 g/dL (ref 12.0–15.0)
Immature Granulocytes: 1 %
Lymphocytes Relative: 13 %
Lymphs Abs: 1.2 10*3/uL (ref 0.7–4.0)
MCH: 27.3 pg (ref 26.0–34.0)
MCHC: 32.5 g/dL (ref 30.0–36.0)
MCV: 83.8 fL (ref 80.0–100.0)
Monocytes Absolute: 0.2 10*3/uL (ref 0.1–1.0)
Monocytes Relative: 2 %
Neutro Abs: 7.3 10*3/uL (ref 1.7–7.7)
Neutrophils Relative %: 84 %
Platelets: 432 10*3/uL — ABNORMAL HIGH (ref 150–400)
RBC: 4.95 MIL/uL (ref 3.87–5.11)
RDW: 13.7 % (ref 11.5–15.5)
WBC: 8.7 10*3/uL (ref 4.0–10.5)
nRBC: 0 % (ref 0.0–0.2)

## 2021-04-23 LAB — LIPASE, BLOOD: Lipase: 22 U/L (ref 11–51)

## 2021-04-23 LAB — I-STAT BETA HCG BLOOD, ED (MC, WL, AP ONLY): I-stat hCG, quantitative: <5 m[IU]/mL (ref <5)

## 2021-04-23 LAB — ETHANOL: Alcohol, Ethyl (B): <10 mg/dL (ref <10)

## 2021-04-23 MED ORDER — ONDANSETRON HCL 4 MG/2ML IJ SOLN
4.0000 mg | Freq: Once | INTRAMUSCULAR | Status: AC
  Administered 2021-04-23: 4 mg via INTRAVENOUS
  Filled 2021-04-23: qty 2

## 2021-04-23 MED ORDER — SODIUM CHLORIDE 0.9 % IV BOLUS
1000.0000 mL | Freq: Once | INTRAVENOUS | Status: AC
  Administered 2021-04-23: 1000 mL via INTRAVENOUS

## 2021-04-23 MED ORDER — ONDANSETRON 4 MG PO TBDP: ORAL_TABLET | ORAL | 0 refills | Status: DC

## 2021-04-23 NOTE — Discharge Instructions (Addendum)
You likely are vomiting from drinking alcohol.  Your labs are unremarkable today  Stay hydrated.  Please avoid drinking alcohol  See your doctor for follow-up  Return to ER if you have worse abdominal pain, vomiting, dehydration
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
## 2021-04-23 NOTE — ED Provider Notes (Signed)
[facility] EMERGENCY DEPARTMENT Provider Note   CSN: [id number] Arrival date & time: 04/23/21  1417     History No chief complaint on file.   Monique Keith is a 25 y.o. female hx of HTN, here presenting with vomiting.  Patient states that she drinks 5 drinks yesterday.  She states that she also ate some ribs that has been out for a while.  Patient has been vomiting since yesterday. Patient states that she was unable to keep anything down.  Denies any fevers  The history is provided by the patient.      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 06/01/2019    Past Surgical History:  Procedure Laterality Date   CESAREAN SECTION N/A 06/03/2019   Procedure: CESAREAN SECTION;  Surgeon: Gerald Leitz, MD;  Location: [facility] LD ORS;  Service: Obstetrics;  Laterality: N/A;   HERNIA REPAIR     UMBILICAL HERNIA REPAIR       OB History     Gravida  1   Para      Term      Preterm      AB      Living         SAB      IAB      Ectopic      Multiple      Live Births              Family History  Problem Relation Age of Onset   Hypertension Maternal Grandmother    Hypertension Maternal Grandfather    Hyperlipidemia Maternal Grandfather    Diabetes Maternal Grandfather     Social History   Tobacco Use   Smoking status: Former    Packs/day: 0.25    Types: Cigarettes    Quit date: 10/08/2018    Years since quitting: 2.5   Smokeless tobacco: Never  Vaping Use   Vaping Use: Never used  Substance Use Topics   Alcohol use: No    Alcohol/week: 0.0 standard drinks   Drug use: Not  Currently    Types: Marijuana    Comment: last use 24 Jan 20    Home Medications Prior to Admission medications   Medication Sig Start Date End Date Taking? Authorizing Provider  acetaminophen (TYLENOL) 500 MG tablet Take 2 tablets (1,000 mg total) by mouth every 6 (six) hours as needed for moderate pain. 06/06/19   Prothero, Henderson Newcomer, CNM  benzonatate (TESSALON) 100 MG capsule Take 1 capsule by mouth every 8 (eight) hours for cough. 01/06/20   Mardella Layman, MD  ferrous sulfate 325 (65 FE) MG tablet Take 1 tablet (325 mg total) by mouth 2 (two) times daily with a meal. 06/06/19   Prothero, Henderson Newcomer, CNM    Allergies    Patient has no known allergies.  Review of Systems   Review of Systems  Gastrointestinal:  Positive for vomiting.  All other systems reviewed and are negative.  Physical Exam Updated Vital Signs BP 113/63   Pulse 60   Temp 98.6 F (37 C)   Resp 17  SpO2 98%   Physical Exam Vitals and nursing note reviewed.  Constitutional:      Appearance: Normal appearance.  HENT:     Head: Normocephalic.     Nose: Nose normal.     Mouth/Throat:     Mouth: Mucous membranes are dry.  Eyes:     Extraocular Movements: Extraocular movements intact.     Pupils: Pupils are equal, round, and reactive to light.  Cardiovascular:     Rate and Rhythm: Normal rate and regular rhythm.     Pulses: Normal pulses.     Heart sounds: Normal heart sounds.  Pulmonary:     Effort: Pulmonary effort is normal.     Breath sounds: Normal breath sounds.  Abdominal:     General: Abdomen is flat.     Palpations: Abdomen is soft.  Musculoskeletal:        General: Normal range of motion.     Cervical back: Normal range of motion and neck supple.  Skin:    General: Skin is warm.     Capillary Refill: Capillary refill takes less than 2 seconds.  Neurological:     General: No focal deficit present.     Mental Status: She is alert and oriented to person, place, and time.  Psychiatric:         Mood and Affect: Mood normal.        Behavior: Behavior normal.    ED Results / Procedures / Treatments   Labs (all labs ordered are listed, but only abnormal results are displayed) Labs Reviewed  CBC WITH DIFFERENTIAL/PLATELET - Abnormal; Notable for the following components:      Result Value   Platelets 432 (*)    All other components within normal limits  COMPREHENSIVE METABOLIC PANEL - Abnormal; Notable for the following components:   CO2 17 (*)    Total Protein 8.6 (*)    All other components within normal limits  LIPASE, BLOOD  ETHANOL  PREGNANCY, URINE  I-STAT BETA HCG BLOOD, ED ([facility], [facility], [facility] ONLY)    EKG None  Radiology No results found.  Procedures Procedures   Medications Ordered in ED Medications  sodium chloride 0.9 % bolus 1,000 mL (1,000 mLs Intravenous New Bag/Given 04/23/21 1805)  ondansetron (ZOFRAN) injection 4 mg (4 mg Intravenous Given 04/23/21 1805)    ED Course  I have reviewed the triage vital signs and the nursing notes.  Pertinent labs & imaging results that were available during my care of the patient were reviewed by me and considered in my medical decision making (see chart for details).    MDM Rules/Calculators/A&P                          Monique Keith is a 25 y.o. female here with vomiting. She drank alcohol yesterday.  Minimal epigastric tenderness.  Plan to get CBC and CMP and hydrate patient  7:42 PM Patient felt better now. Labs unremarkable. Tolerated PO. Stable for discharge     Final Clinical Impression(s) / ED Diagnoses Final diagnoses:  None    Rx / DC Orders ED Discharge Orders     None        Charlynne Pander, MD 04/23/21 1943
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
## 2021-05-16 ENCOUNTER — Other Ambulatory Visit: Payer: Self-pay

## 2021-05-16 ENCOUNTER — Emergency Department (HOSPITAL_BASED_OUTPATIENT_CLINIC_OR_DEPARTMENT_OTHER): Payer: BC Managed Care – PPO | Admitting: Radiology

## 2021-05-16 ENCOUNTER — Encounter (HOSPITAL_BASED_OUTPATIENT_CLINIC_OR_DEPARTMENT_OTHER): Payer: Self-pay | Admitting: *Deleted

## 2021-05-16 DIAGNOSIS — R0981 Nasal congestion: Secondary | ICD-10-CM | POA: Insufficient documentation

## 2021-05-16 DIAGNOSIS — Z5321 Procedure and treatment not carried out due to patient leaving prior to being seen by health care provider: Secondary | ICD-10-CM | POA: Insufficient documentation

## 2021-05-16 DIAGNOSIS — R072 Precordial pain: Secondary | ICD-10-CM | POA: Diagnosis not present

## 2021-05-16 DIAGNOSIS — Z20822 Contact with and (suspected) exposure to covid-19: Secondary | ICD-10-CM | POA: Insufficient documentation

## 2021-05-16 DIAGNOSIS — R059 Cough, unspecified: Secondary | ICD-10-CM | POA: Insufficient documentation

## 2021-05-16 DIAGNOSIS — R519 Headache, unspecified: Secondary | ICD-10-CM | POA: Diagnosis not present

## 2021-05-16 LAB — RESP PANEL BY RT-PCR (FLU A&B, COVID) ARPGX2
Influenza A by PCR: NEGATIVE
Influenza B by PCR: NEGATIVE
SARS Coronavirus 2 by RT PCR: NEGATIVE

## 2021-05-16 NOTE — ED Triage Notes (Signed)
Pt states she developed mid sternal chest pain yesterday, Worse when coughs. C/O HA, congestion, denies N/V. Has not taken any OTC meds.

## 2021-05-17 ENCOUNTER — Emergency Department (HOSPITAL_BASED_OUTPATIENT_CLINIC_OR_DEPARTMENT_OTHER)
Admission: EM | Admit: 2021-05-17 | Discharge: 2021-05-17 | Disposition: A | Discharge status: Home / Self Care | Payer: BC Managed Care – PPO | Attending: Emergency Medicine | Admitting: Emergency Medicine

## 2021-05-19 ENCOUNTER — Emergency Department (HOSPITAL_BASED_OUTPATIENT_CLINIC_OR_DEPARTMENT_OTHER)
Admission: EM | Admit: 2021-05-19 | Discharge: 2021-05-19 | Disposition: A | Discharge status: Home / Self Care | Payer: BC Managed Care – PPO | Attending: Emergency Medicine | Admitting: Emergency Medicine

## 2021-05-19 ENCOUNTER — Other Ambulatory Visit: Payer: Self-pay

## 2021-05-19 DIAGNOSIS — H9202 Otalgia, left ear: Secondary | ICD-10-CM | POA: Diagnosis not present

## 2021-05-19 DIAGNOSIS — I10 Essential (primary) hypertension: Secondary | ICD-10-CM | POA: Diagnosis not present

## 2021-05-19 DIAGNOSIS — Z87891 Personal history of nicotine dependence: Secondary | ICD-10-CM | POA: Insufficient documentation

## 2021-05-19 DIAGNOSIS — B9789 Other viral agents as the cause of diseases classified elsewhere: Secondary | ICD-10-CM | POA: Diagnosis not present

## 2021-05-19 DIAGNOSIS — D72829 Elevated white blood cell count, unspecified: Secondary | ICD-10-CM | POA: Insufficient documentation

## 2021-05-19 DIAGNOSIS — J029 Acute pharyngitis, unspecified: Secondary | ICD-10-CM | POA: Insufficient documentation

## 2021-05-19 LAB — COMPREHENSIVE METABOLIC PANEL
ALT: <5 U/L (ref 0–44)
AST: 10 U/L — ABNORMAL LOW (ref 15–41)
Albumin: 4.2 g/dL (ref 3.5–5.0)
Alkaline Phosphatase: 49 U/L (ref 38–126)
Anion gap: 12 (ref 5–15)
BUN: 10 mg/dL (ref 6–20)
CO2: 22 mmol/L (ref 22–32)
Calcium: 9.4 mg/dL (ref 8.9–10.3)
Chloride: 104 mmol/L (ref 98–111)
Creatinine, Ser: 0.77 mg/dL (ref 0.44–1.00)
GFR, Estimated: >60 mL/min (ref >60)
Glucose, Bld: 82 mg/dL (ref 70–99)
Potassium: 3.8 mmol/L (ref 3.5–5.1)
Sodium: 138 mmol/L (ref 135–145)
Total Bilirubin: 0.5 mg/dL (ref 0.3–1.2)
Total Protein: 7.8 g/dL (ref 6.5–8.1)

## 2021-05-19 LAB — CBC WITH DIFFERENTIAL/PLATELET
Abs Immature Granulocytes: 0.04 10*3/uL (ref 0.00–0.07)
Basophils Absolute: 0 10*3/uL (ref 0.0–0.1)
Basophils Relative: 0 %
Eosinophils Absolute: 0.2 10*3/uL (ref 0.0–0.5)
Eosinophils Relative: 2 %
HCT: 37.5 % (ref 36.0–46.0)
Hemoglobin: 12.2 g/dL (ref 12.0–15.0)
Immature Granulocytes: 0 %
Lymphocytes Relative: 13 %
Lymphs Abs: 1.5 10*3/uL (ref 0.7–4.0)
MCH: 26.6 pg (ref 26.0–34.0)
MCHC: 32.5 g/dL (ref 30.0–36.0)
MCV: 81.7 fL (ref 80.0–100.0)
Monocytes Absolute: 1.2 10*3/uL — ABNORMAL HIGH (ref 0.1–1.0)
Monocytes Relative: 10 %
Neutro Abs: 8.4 10*3/uL — ABNORMAL HIGH (ref 1.7–7.7)
Neutrophils Relative %: 75 %
Platelets: 355 10*3/uL (ref 150–400)
RBC: 4.59 MIL/uL (ref 3.87–5.11)
RDW: 13.8 % (ref 11.5–15.5)
WBC: 11.3 10*3/uL — ABNORMAL HIGH (ref 4.0–10.5)
nRBC: 0 % (ref 0.0–0.2)

## 2021-05-19 LAB — MONONUCLEOSIS SCREEN: Mono Screen: NEGATIVE

## 2021-05-19 LAB — GROUP A STREP BY PCR: Group A Strep by PCR: NOT DETECTED

## 2021-05-19 MED ORDER — DEXAMETHASONE SODIUM PHOSPHATE 10 MG/ML IJ SOLN
10.0000 mg | Freq: Once | INTRAMUSCULAR | Status: AC
  Administered 2021-05-19: 10 mg via INTRAVENOUS
  Filled 2021-05-19: qty 1

## 2021-05-19 MED ORDER — LACTATED RINGERS IV BOLUS
1000.0000 mL | Freq: Once | INTRAVENOUS | Status: AC
  Administered 2021-05-19: 1000 mL via INTRAVENOUS

## 2021-05-19 NOTE — Discharge Instructions (Signed)
Drink plenty of fluids.  Take ibuprofen and/or acetaminophen as needed for fever or aching.  Return if symptoms worsen, especially if you ever get to the point where you are unable to swallow.

## 2021-05-19 NOTE — ED Provider Notes (Signed)
[facility] EMERGENCY DEPT Provider Note   CSN: [id number] Arrival date & time: 05/19/21  0113     History Chief Complaint  Patient presents with   Sore Throat   Otalgia    Monique Keith is a 25 y.o. female.  The history is provided by the patient.  Sore Throat  Otalgia She has history of hypertension and comes in complaining of sore throat for the last 3 days.  Pain is on the left side and radiates to the left ear.  It is painful to swallow.  She denies fever or chills or sweats.  She has had some clear rhinorrhea and cough productive of a small amount of green sputum.  She denies nausea, vomiting, diarrhea.  She has taken ibuprofen which does give her temporary relief.   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 06/01/2019    Past Surgical History:  Procedure Laterality Date   CESAREAN SECTION N/A 06/03/2019   Procedure: CESAREAN SECTION;  Surgeon: Gerald Leitz, MD;  Location: [facility] LD ORS;  Service: Obstetrics;  Laterality: N/A;   HERNIA REPAIR     UMBILICAL HERNIA REPAIR       OB History     Gravida  1   Para      Term      Preterm      AB      Living         SAB      IAB      Ectopic      Multiple      Live Births              Family History  Problem Relation Age of Onset   Hypertension Maternal Grandmother    Hypertension Maternal Grandfather    Hyperlipidemia Maternal Grandfather    Diabetes Maternal Grandfather     Social History   Tobacco Use   Smoking status: Former    Packs/day: 0.25    Types: Cigarettes    Quit date: 10/08/2018    Years since quitting:  2.6   Smokeless tobacco: Never  Vaping Use   Vaping Use: Never used  Substance Use Topics   Alcohol use: No    Alcohol/week: 0.0 standard drinks   Drug use: Not Currently    Types: Marijuana    Comment: last use 24 Jan 20    Home Medications Prior to Admission medications   Medication Sig Start Date End Date Taking? Authorizing Provider  acetaminophen (TYLENOL) 500 MG tablet Take 2 tablets (1,000 mg total) by mouth every 6 (six) hours as needed for moderate pain. 06/06/19   Prothero, Henderson Newcomer, CNM  benzonatate (TESSALON) 100 MG capsule Take 1 capsule by mouth every 8 (eight) hours for cough. 01/06/20   Mardella Layman, MD  ferrous sulfate 325 (65 FE) MG tablet Take 1 tablet (325 mg total) by mouth 2 (two) times daily with a meal. 06/06/19   Prothero, Henderson Newcomer, CNM  ondansetron (ZOFRAN ODT) 4 MG disintegrating tablet 4mg  ODT q4 hours prn nausea/vomit 04/23/21   06/23/21, MD  Allergies    Patient has no known allergies.  Review of Systems   Review of Systems  HENT:  Positive for ear pain.   All other systems reviewed and are negative.  Physical Exam Updated Vital Signs BP (!) 137/91 (BP Location: Right Arm)   Pulse (!) 106   Temp 98.2 F (36.8 C) (Oral)   Resp 16   Ht 5\' 3"  (1.6 m)   Wt 63.5 kg   LMP 05/02/2021 (Approximate)   SpO2 100%   BMI 24.80 kg/m   Physical Exam Vitals and nursing note reviewed.  25 year old female, resting comfortably and in no acute distress. Vital signs are significant for borderline elevated blood pressure and mildly elevated heart rate. Oxygen saturation is 100%, which is normal. Head is normocephalic and atraumatic. PERRLA, EOMI. Tympanic membranes are clear.  Oropharynx is moderately erythematous without exudate or swelling.  There is no pooling of secretions.  Phonation is normal. Neck is nontender and supple with bilateral posterior cervical adenopathy. Back is nontender and there is no CVA tenderness. Lungs are clear without  rales, wheezes, or rhonchi. Chest is nontender. Heart has regular rate and rhythm without murmur. Abdomen is soft, flat, nontender without masses or hepatosplenomegaly and peristalsis is normoactive. Extremities have no cyanosis or edema, full range of motion is present. Skin is warm and dry without rash. Neurologic: Mental status is normal, cranial nerves are intact, there are no motor or sensory deficits.  ED Results / Procedures / Treatments   Labs (all labs ordered are listed, but only abnormal results are displayed) Labs Reviewed  CBC WITH DIFFERENTIAL/PLATELET - Abnormal; Notable for the following components:      Result Value   WBC 11.3 (*)    Neutro Abs 8.4 (*)    Monocytes Absolute 1.2 (*)    All other components within normal limits  COMPREHENSIVE METABOLIC PANEL - Abnormal; Notable for the following components:   AST 10 (*)    All other components within normal limits  GROUP A STREP BY PCR  MONONUCLEOSIS SCREEN   Procedures Procedures   Medications Ordered in ED Medications  lactated ringers bolus 1,000 mL (1,000 mLs Intravenous New Bag/Given 05/19/21 0242)  dexamethasone (DECADRON) injection 10 mg (10 mg Intravenous Given 05/19/21 0242)    ED Course  I have reviewed the triage vital signs and the nursing notes.  Pertinent lab results that were available during my care of the patient were reviewed by me and considered in my medical decision making (see chart for details).   MDM Rules/Calculators/A&P                         Pharyngitis with referred pain to the left ear.  Strep screen was obtained at triage which is negative.  Old records reviewed showing ED visit 2 days ago with negative respiratory pathogen PCR test at that time (patient left without being seen).  This is likely a viral pharyngitis, will check CBC, metabolic panel, monoscreen and give IV fluids and dexamethasone.  Monoscreen is negative.  CBC shows mild leukocytosis with slight increase in monocytes  as well as neutrophils.  Metabolic panel is unremarkable.  She feels somewhat better following above-noted treatment.  She is discharged with instructions to continue conservative treatment measures, return precautions discussed. Final Clinical Impression(s) / ED Diagnoses Final diagnoses:  Viral pharyngitis    Rx / DC Orders ED Discharge Orders     None  Dione Booze, MD 05/19/21 [phone]

## 2021-05-19 NOTE — ED Triage Notes (Signed)
Pt reports sore throat and left ear pain - pt was here 2 days back for same

## 2021-05-22 ENCOUNTER — Encounter (HOSPITAL_BASED_OUTPATIENT_CLINIC_OR_DEPARTMENT_OTHER): Payer: Self-pay | Admitting: *Deleted

## 2021-05-22 ENCOUNTER — Other Ambulatory Visit: Payer: Self-pay

## 2021-05-22 ENCOUNTER — Emergency Department (HOSPITAL_BASED_OUTPATIENT_CLINIC_OR_DEPARTMENT_OTHER)
Admission: EM | Admit: 2021-05-22 | Discharge: 2021-05-22 | Disposition: A | Discharge status: Home / Self Care | Payer: BC Managed Care – PPO | Attending: Emergency Medicine | Admitting: Emergency Medicine

## 2021-05-22 DIAGNOSIS — Z87891 Personal history of nicotine dependence: Secondary | ICD-10-CM | POA: Diagnosis not present

## 2021-05-22 DIAGNOSIS — J029 Acute pharyngitis, unspecified: Secondary | ICD-10-CM | POA: Insufficient documentation

## 2021-05-22 DIAGNOSIS — I10 Essential (primary) hypertension: Secondary | ICD-10-CM | POA: Insufficient documentation

## 2021-05-22 MED ORDER — CLINDAMYCIN HCL 300 MG PO CAPS: 300.0000 mg | ORAL_CAPSULE | Freq: Four times a day (QID) | ORAL | 0 refills | Status: DC

## 2021-05-22 MED ORDER — DEXAMETHASONE SODIUM PHOSPHATE 10 MG/ML IJ SOLN: 10.0000 mg | Freq: Once | INTRAMUSCULAR | Status: DC

## 2021-05-22 MED ORDER — DEXAMETHASONE SODIUM PHOSPHATE 10 MG/ML IJ SOLN
10.0000 mg | Freq: Once | INTRAMUSCULAR | Status: AC
  Administered 2021-05-22: 10 mg via INTRAMUSCULAR
  Filled 2021-05-22: qty 1

## 2021-05-22 NOTE — ED Provider Notes (Signed)
[facility] EMERGENCY DEPT Provider Note   CSN: [id number] Arrival date & time: 05/22/21  1100     History Chief Complaint  Patient presents with   Sore Throat   Cough    Monique Keith is a 25 y.o. female.  Patient is a 25 year old female who presents with a sore throat.  She says she has had a sore throat and some congestion for about 2 weeks.  She was seen here 3 days ago and had Monospot that was negative.  Her rapid strep was negative.  Her COVID and flu swab was negative.  She says that she still has some mild congestion but also sore throat.  She says it hurts when she swallows.  She has a bit of a cough but her main complaint is a sore throat and pain on swallowing.  She denies any ongoing fevers.  No shortness of breath.      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 06/01/2019    Past Surgical History:  Procedure Laterality Date   CESAREAN SECTION N/A 06/03/2019   Procedure: CESAREAN SECTION;  Surgeon: Gerald Leitz, MD;  Location: [facility] LD ORS;  Service: Obstetrics;  Laterality: N/A;   HERNIA REPAIR     UMBILICAL HERNIA REPAIR       OB History     Gravida  1   Para      Term      Preterm      AB      Living         SAB      IAB      Ectopic      Multiple      Live Births              Family History  Problem Relation Age of Onset   Hypertension Maternal Grandmother    Hypertension Maternal Grandfather    Hyperlipidemia Maternal Grandfather    Diabetes Maternal Grandfather     Social History   Tobacco Use   Smoking status: Former    Packs/day: 0.25    Types:  Cigarettes    Quit date: 10/08/2018    Years since quitting: 2.6   Smokeless tobacco: Never  Vaping Use   Vaping Use: Never used  Substance Use Topics   Alcohol use: No    Alcohol/week: 0.0 standard drinks   Drug use: Not Currently    Types: Marijuana    Comment: last use 24 Jan 20    Home Medications Prior to Admission medications   Medication Sig Start Date End Date Taking? Authorizing Provider  clindamycin (CLEOCIN) 300 MG capsule Take 1 capsule (300 mg total) by mouth 4 (four) times daily. X 7 days 05/22/21  Yes Rolan Bucco, MD  ibuprofen (ADVIL) 200 MG tablet Take 200 mg by mouth every 6 (six) hours as needed.   Yes [provider]    Allergies    Patient has no known allergies.  Review of Systems   Review of Systems  Constitutional:  Negative for chills, diaphoresis, fatigue and fever.  HENT:  Positive for  congestion, sore throat and trouble swallowing. Negative for rhinorrhea, sneezing and voice change.   Eyes: Negative.   Respiratory:  Positive for cough. Negative for chest tightness and shortness of breath.   Cardiovascular:  Negative for chest pain and leg swelling.  Gastrointestinal:  Negative for abdominal pain, blood in stool, diarrhea, nausea and vomiting.  Genitourinary:  Negative for difficulty urinating, flank pain, frequency and hematuria.  Musculoskeletal:  Negative for arthralgias and back pain.  Skin:  Negative for rash.  Neurological:  Negative for dizziness, speech difficulty, weakness, numbness and headaches.   Physical Exam Updated Vital Signs BP (!) 141/96 (BP Location: Right Arm)   Pulse 69   Temp 98.6 F (37 C)   Resp 16   Ht 5\' 3"  (1.6 m)   Wt 63.5 kg   LMP 05/02/2021 (Approximate)   SpO2 100%   BMI 24.80 kg/m   Physical Exam Constitutional:      Appearance: She is well-developed.  HENT:     Head: Normocephalic and atraumatic.     Right Ear: Tympanic membrane normal.     Left Ear: Tympanic membrane normal.      Mouth/Throat:     Mouth: Mucous membranes are moist.     Comments: Mild erythema to the posterior pharynx, no exudates, uvula is midline, no peritonsillar fullness, no trismus, patient is controlling her secretions, no voice change Eyes:     Pupils: Pupils are equal, round, and reactive to light.  Cardiovascular:     Rate and Rhythm: Normal rate and regular rhythm.     Heart sounds: Normal heart sounds.  Pulmonary:     Effort: Pulmonary effort is normal. No respiratory distress.     Breath sounds: Normal breath sounds. No wheezing or rales.  Chest:     Chest wall: No tenderness.  Abdominal:     General: Bowel sounds are normal.     Palpations: Abdomen is soft.     Tenderness: There is no abdominal tenderness. There is no guarding or rebound.  Musculoskeletal:        General: Normal range of motion.     Cervical back: Normal range of motion and neck supple.  Lymphadenopathy:     Cervical: No cervical adenopathy.  Skin:    General: Skin is warm and dry.     Findings: No rash.  Neurological:     Mental Status: She is alert and oriented to person, place, and time.    ED Results / Procedures / Treatments   Labs (all labs ordered are listed, but only abnormal results are displayed) Labs Reviewed - No data to display  EKG None  Radiology No results found.  Procedures Procedures   Medications Ordered in ED Medications  dexamethasone (DECADRON) injection 10 mg (10 mg Intramuscular Given 05/22/21 1205)    ED Course  I have reviewed the triage vital signs and the nursing notes.  Pertinent labs & imaging results that were available during my care of the patient were reviewed by me and considered in my medical decision making (see chart for details).    MDM Rules/Calculators/A&P                           Pt presents with ongoing sore throat.  She had negative test to include strep, mono and COVID/flu a few days ago.  She does not have any clinical findings that would be  more suggestive of peritonsillar abscess or deep tissue infection.  She is otherwise well-appearing.  Given the ongoing symptoms, will give her a shot of Decadron and start clindamycin.  Return precautions were given. Final Clinical Impression(s) / ED Diagnoses Final diagnoses:  Pharyngitis, unspecified etiology    Rx / DC Orders ED Discharge Orders          Ordered    clindamycin (CLEOCIN) 300 MG capsule  4 times daily        05/22/21 1211             Rolan Bucco, MD 05/22/21 1221

## 2021-05-22 NOTE — ED Triage Notes (Signed)
Patient was here 3 days ago for sore throat and is here for sore throat and difficulty swallowing.  Pt also has a moist, strong cough.

## 2021-05-22 NOTE — ED Notes (Signed)
Dc instructions given and pt voiced understanding.

## 2021-05-22 NOTE — Discharge Instructions (Addendum)
Return to emergency room if you have any worsening pain, difficulty swallowing or shortness of breath.

## 2021-07-23 ENCOUNTER — Emergency Department (HOSPITAL_BASED_OUTPATIENT_CLINIC_OR_DEPARTMENT_OTHER)
Admission: EM | Admit: 2021-07-23 | Discharge: 2021-07-23 | Disposition: A | Discharge status: Home / Self Care | Payer: BC Managed Care – PPO | Attending: Emergency Medicine | Admitting: Emergency Medicine

## 2021-07-23 ENCOUNTER — Encounter (HOSPITAL_BASED_OUTPATIENT_CLINIC_OR_DEPARTMENT_OTHER): Payer: Self-pay | Admitting: *Deleted

## 2021-07-23 ENCOUNTER — Other Ambulatory Visit: Payer: Self-pay

## 2021-07-23 DIAGNOSIS — M549 Dorsalgia, unspecified: Secondary | ICD-10-CM | POA: Diagnosis not present

## 2021-07-23 DIAGNOSIS — Z87891 Personal history of nicotine dependence: Secondary | ICD-10-CM | POA: Diagnosis not present

## 2021-07-23 DIAGNOSIS — R059 Cough, unspecified: Secondary | ICD-10-CM | POA: Insufficient documentation

## 2021-07-23 DIAGNOSIS — R519 Headache, unspecified: Secondary | ICD-10-CM | POA: Insufficient documentation

## 2021-07-23 DIAGNOSIS — M791 Myalgia, unspecified site: Secondary | ICD-10-CM | POA: Insufficient documentation

## 2021-07-23 DIAGNOSIS — R0981 Nasal congestion: Secondary | ICD-10-CM | POA: Insufficient documentation

## 2021-07-23 DIAGNOSIS — Z20822 Contact with and (suspected) exposure to covid-19: Secondary | ICD-10-CM | POA: Insufficient documentation

## 2021-07-23 DIAGNOSIS — J029 Acute pharyngitis, unspecified: Secondary | ICD-10-CM | POA: Diagnosis not present

## 2021-07-23 DIAGNOSIS — R079 Chest pain, unspecified: Secondary | ICD-10-CM | POA: Diagnosis not present

## 2021-07-23 DIAGNOSIS — R0602 Shortness of breath: Secondary | ICD-10-CM | POA: Diagnosis present

## 2021-07-23 DIAGNOSIS — I10 Essential (primary) hypertension: Secondary | ICD-10-CM | POA: Insufficient documentation

## 2021-07-23 DIAGNOSIS — J069 Acute upper respiratory infection, unspecified: Secondary | ICD-10-CM

## 2021-07-23 DIAGNOSIS — R509 Fever, unspecified: Secondary | ICD-10-CM | POA: Diagnosis not present

## 2021-07-23 DIAGNOSIS — Z28310 Unvaccinated for covid-19: Secondary | ICD-10-CM | POA: Diagnosis not present

## 2021-07-23 DIAGNOSIS — R0789 Other chest pain: Secondary | ICD-10-CM | POA: Diagnosis not present

## 2021-07-23 LAB — RESP PANEL BY RT-PCR (FLU A&B, COVID) ARPGX2
Influenza A by PCR: NEGATIVE
Influenza B by PCR: NEGATIVE
SARS Coronavirus 2 by RT PCR: NEGATIVE

## 2021-07-23 MED ORDER — BENZONATATE 100 MG PO CAPS: 100.0000 mg | ORAL_CAPSULE | Freq: Three times a day (TID) | ORAL | 0 refills | Status: DC

## 2021-07-23 NOTE — ED Notes (Signed)
Opened chart at pts request to review test results.

## 2021-07-23 NOTE — ED Notes (Signed)
Pt called to see if she could get results of Covid test - opened chart to see if results were charted and gave note to charge nurse to speak with patient.

## 2021-07-23 NOTE — ED Provider Notes (Signed)
[facility] EMERGENCY DEPT Provider Note   CSN: [id number] Arrival date & time: 07/23/21  1553     History Chief Complaint  Patient presents with   Shortness of Breath   Generalized Body Aches   Chest Pain    Monique Keith is a 25 y.o. female.   Shortness of Breath Associated symptoms: chest pain, fever, headaches and sore throat   Chest Pain Associated symptoms: fever, headache and shortness of breath    Patient presents with cough, headache, back pain, chest pain, and generalized body aches.  She has 2 coworkers who tested positive for COVID in the last 2 days with the similar symptoms as she does.  Her symptoms have been ongoing for 3 days, she is not vaccinated against the flu or COVID.  She has not tried any alleviating factors at home, coughing seems to aggravate the pain in her chest and her back.  She denies any pleuritic pain, she not on oral birth control.  No recent surgeries or travel.  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 06/01/2019    Past Surgical History:  Procedure Laterality Date   CESAREAN SECTION N/A 06/03/2019   Procedure: CESAREAN SECTION;  Surgeon: Gerald Leitz, MD;  Location: [facility] LD ORS;  Service: Obstetrics;  Laterality: N/A;   HERNIA REPAIR     UMBILICAL HERNIA REPAIR       OB History     Gravida  1   Para      Term      Preterm      AB      Living         SAB      IAB      Ectopic      Multiple      Live Births              Family History  Problem Relation Age of Onset   Hypertension Maternal Grandmother    Hypertension Maternal  Grandfather    Hyperlipidemia Maternal Grandfather    Diabetes Maternal Grandfather     Social History   Tobacco Use   Smoking status: Former    Packs/day: 0.25    Types: Cigarettes    Quit date: 10/08/2018    Years since quitting: 2.7   Smokeless tobacco: Never  Vaping Use   Vaping Use: Never used  Substance Use Topics   Alcohol use: No    Alcohol/week: 0.0 standard drinks   Drug use: Not Currently    Types: Marijuana    Comment: last use 24 Jan 20    Home Medications Prior to Admission medications   Not on File    Allergies    Patient has no known allergies.  Review of Systems   Review of Systems  Constitutional:  Positive for fever.  HENT:  Positive for congestion and sore throat.   Respiratory:  Positive for shortness of breath.   Cardiovascular:  Positive for chest pain.  Musculoskeletal:  Positive for myalgias.  Neurological:  Positive for headaches.   Physical Exam Updated Vital Signs BP 138/89 (BP Location:  Right Arm)   Pulse 65   Temp 98.6 F (37 C)   Ht 5\' 3"  (1.6 m)   Wt 63.5 kg   SpO2 100%   BMI 24.80 kg/m   Physical Exam Vitals and nursing note reviewed. Exam conducted with a chaperone present.  Constitutional:      Appearance: Normal appearance.  HENT:     Head: Normocephalic.     Nose: Congestion present.     Mouth/Throat:     Pharynx: Posterior oropharyngeal erythema present.  Eyes:     Extraocular Movements: Extraocular movements intact.     Pupils: Pupils are equal, round, and reactive to light.  Cardiovascular:     Rate and Rhythm: Normal rate and regular rhythm.  Pulmonary:     Effort: Pulmonary effort is normal.     Breath sounds: Normal breath sounds.     Comments: Lungs CTA bilaterally. No accessory muscle use. Speaking in complete sentences.  Abdominal:     General: Abdomen is flat.     Palpations: Abdomen is soft.  Musculoskeletal:     Cervical back: Normal range of motion.  Neurological:     Mental Status: She is  alert.  Psychiatric:        Mood and Affect: Mood normal.    ED Results / Procedures / Treatments   Labs (all labs ordered are listed, but only abnormal results are displayed) Labs Reviewed  RESP PANEL BY RT-PCR (FLU A&B, COVID) [id number]    EKG None  Radiology No results found.  Procedures Procedures   Medications Ordered in ED Medications - No data to display  ED Course  I have reviewed the triage vital signs and the nursing notes.  Pertinent labs & imaging results that were available during my care of the patient were reviewed by me and considered in my medical decision making (see chart for details).    MDM Rules/Calculators/A&P                           Vitals are stable, she is nontoxic-appearing.  EKG was were ordered in triage secondary to the patient stating she had chest pain.  Chest pain is not pleuritic, no risk factors for DVT.  She is not tachycardic or hypoxia.  Doubt PE.  EKG does not show signs concerning of ACS.  Her age is also a protective factor.  The pain is reproducible, worsened by coughing.  Suspect it is consistent with generalized body aches.  Will COVID and flu test the patient, note provided for work.  Patient discharged in stable position.  Final Clinical Impression(s) / ED Diagnoses Final diagnoses:  None    Rx / DC Orders ED Discharge Orders     None        , PA-C 07/23/21 1650    13/05/22, MD 08/05/21 [phone]

## 2021-07-23 NOTE — ED Triage Notes (Signed)
Generalized body aches, chest pain, head ache 3 days ago.

## 2021-07-23 NOTE — Discharge Instructions (Addendum)
Expect URI symptoms to last for 14-21 days. Dry cough can last up to 4 weeks. Covid test will result in 24 hours, check on my chart. You should receive call if result is positive. To help yourself recover, drink plenty of fluids and get plenty of rest.   For fever, HA, sore throat or body aches - take tylenol (500-100 mg) every 8 hours. Do not exceed 3000mg/day. You ca also take ibuprofen.   For cough, take tessalon perles every 8 hours as needed. You can also use cough drops or drink honey.  Return if things worsen or change.   

## 2021-08-08 ENCOUNTER — Emergency Department (HOSPITAL_BASED_OUTPATIENT_CLINIC_OR_DEPARTMENT_OTHER): Payer: BC Managed Care – PPO | Admitting: Radiology

## 2021-08-08 ENCOUNTER — Other Ambulatory Visit: Payer: Self-pay

## 2021-08-08 ENCOUNTER — Emergency Department (HOSPITAL_BASED_OUTPATIENT_CLINIC_OR_DEPARTMENT_OTHER)
Admission: EM | Admit: 2021-08-08 | Discharge: 2021-08-08 | Disposition: A | Discharge status: Home / Self Care | Payer: BC Managed Care – PPO | Attending: Emergency Medicine | Admitting: Emergency Medicine

## 2021-08-08 ENCOUNTER — Encounter (HOSPITAL_BASED_OUTPATIENT_CLINIC_OR_DEPARTMENT_OTHER): Payer: Self-pay | Admitting: Emergency Medicine

## 2021-08-08 DIAGNOSIS — S4991XA Unspecified injury of right shoulder and upper arm, initial encounter: Secondary | ICD-10-CM | POA: Diagnosis not present

## 2021-08-08 DIAGNOSIS — Y93K9 Activity, other involving animal care: Secondary | ICD-10-CM | POA: Diagnosis not present

## 2021-08-08 DIAGNOSIS — Z87891 Personal history of nicotine dependence: Secondary | ICD-10-CM | POA: Diagnosis not present

## 2021-08-08 DIAGNOSIS — X501XXA Overexertion from prolonged static or awkward postures, initial encounter: Secondary | ICD-10-CM | POA: Insufficient documentation

## 2021-08-08 DIAGNOSIS — I10 Essential (primary) hypertension: Secondary | ICD-10-CM | POA: Insufficient documentation

## 2021-08-08 MED ORDER — CELECOXIB 200 MG PO CAPS: 200.0000 mg | ORAL_CAPSULE | Freq: Two times a day (BID) | ORAL | 0 refills | Status: DC

## 2021-08-08 NOTE — Discharge Instructions (Addendum)
Get help right away if: Your arm, hand, or fingers are numb or tingling. Your arm, hand, or fingers are swollen or painful or they turn white or blue. Your hand or fingers on your injured arm are colder than your other hand.

## 2021-08-08 NOTE — ED Provider Notes (Signed)
[facility] EMERGENCY DEPT Provider Note   CSN: [id number] Arrival date & time: 08/08/21  1156     History Chief Complaint  Patient presents with   Shoulder Injury    Monique Keith is a 25 y.o. female who sustained a right shoulder injury 1 day(s) ago. Mechanism of injury: patient Hyperextended the R shoulder. Immediate symptoms: immediate pain. Symptoms have been acute and worsening since that time. Prior history of related problems: no prior problems with this area in the past.   Shoulder Injury      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 06/01/2019    Past Surgical History:  Procedure Laterality Date   CESAREAN SECTION N/A 06/03/2019   Procedure: CESAREAN SECTION;  Surgeon: Gerald Leitz, MD;  Location: [facility] LD [facility]S;  Service: Obstetrics;  Laterality: N/A;   HERNIA REPAIR     UMBILICAL HERNIA REPAIR       OB History     Gravida  1   Para      Term      Preterm      AB      Living         SAB      IAB      Ectopic      Multiple      Live Births              Family History  Problem Relation Age of Onset   Hypertension Maternal Grandmother    Hypertension Maternal Grandfather    Hyperlipidemia Maternal Grandfather    Diabetes Maternal Grandfather     Social History   Tobacco Use   Smoking status: Former    Packs/day: 0.25    Types: Cigarettes    Quit date: 10/08/2018    Years since quitting: 2.8   Smokeless tobacco: Never  Vaping Use   Vaping Use: Never used  Substance Use Topics   Alcohol use: No    Alcohol/week: 0.0 standard drinks   Drug use: Not  Currently    Types: Marijuana    Comment: last use 24 Jan 20    Home Medications Prior to Admission medications   Medication Sig Start Date End Date Taking? Authorizing Provider  benzonatate (TESSALON) 100 MG capsule Take 1 capsule (100 mg total) by mouth every 8 (eight) hours. 07/23/21   Theron Arista, PA-C    Allergies    Patient has no known allergies.  Review of Systems   Review of Systems  Musculoskeletal:  Positive for joint swelling.  Neurological:  Negative for weakness and numbness.   Physical Exam Updated Vital Signs BP (!) 115/94 (BP Location: Left Arm)   Pulse 65   Temp 98.6 F (37 C)   Resp 16   Ht 5\' 3"  (1.6 m)   Wt 63.5 kg   SpO2 99%   BMI 24.80 kg/m   Physical Exam Vitals and nursing note reviewed.  Constitutional:      General: She is not in acute distress.    Appearance: She is well-developed. She is not diaphoretic.  HENT:  Head: Normocephalic and atraumatic.     Right Ear: External ear normal.     Left Ear: External ear normal.     Nose: Nose normal.     Mouth/Throat:     Mouth: Mucous membranes are moist.  Eyes:     General: No scleral icterus.    Conjunctiva/sclera: Conjunctivae normal.  Cardiovascular:     Rate and Rhythm: Normal rate and regular rhythm.     Heart sounds: Normal heart sounds. No murmur heard.   No friction rub. No gallop.  Pulmonary:     Effort: Pulmonary effort is normal. No respiratory distress.     Breath sounds: Normal breath sounds.  Abdominal:     General: Bowel sounds are normal. There is no distension.     Palpations: Abdomen is soft. There is no mass.     Tenderness: There is no abdominal tenderness. There is no guarding.  Musculoskeletal:     Cervical back: Normal range of motion.     Comments: Unable to tolerate any range of motion of the right shoulder.  Tender to palpation over the anterior portion of the shoulder.  Normal ipsilateral right elbow hand and wrist examination.  Skin:    General: Skin is  warm and dry.  Neurological:     Mental Status: She is alert and oriented to person, place, and time.  Psychiatric:        Behavior: Behavior normal.    ED Results / Procedures / Treatments   Labs (all labs ordered are listed, but only abnormal results are displayed) Labs Reviewed - No data to display  EKG None  Radiology DG Shoulder Right  Result Date: 08/08/2021 CLINICAL DATA:  Acute right shoulder pain after injury. EXAM: RIGHT SHOULDER - 2+ VIEW COMPARISON:  None. FINDINGS: There is no evidence of fracture or dislocation. There is no evidence of arthropathy or other focal bone abnormality. Soft tissues are unremarkable. IMPRESSION: Negative. Electronically Signed   By: Lupita Raider M.D.   On: 08/08/2021 12:55    Procedures Procedures   Medications Ordered in ED Medications - No data to display  ED Course  I have reviewed the triage vital signs and the nursing notes.  Pertinent labs & imaging results that were available during my care of the patient were reviewed by me and considered in my medical decision making (see chart for details).    MDM Rules/Calculators/A&P   Patient X-Ray negative for obvious fracture or dislocation. Pain managed in ED. Pt advised to follow up with orthopedics for further assessment. Patient given brace while in ED, conservative therapy recommended and discussed. Patient will be dc home & is agreeable with above plan.  Final Clinical Impression(s) / ED Diagnoses Final diagnoses:  None    Rx / DC Orders ED Discharge Orders     None        Arthor Captain, PA-C 08/08/21 1425    Gloris Manchester, MD 08/09/21 1935

## 2021-08-08 NOTE — ED Triage Notes (Signed)
Pt arrives to ED with c/o of right sided shoulder injury. This started yesterday. This started when pt was disciplining her dog by abducting her right arm. She reports she heard a "pop" when the pain started. No numbness or tingling in the right arm.

## 2021-09-04 ENCOUNTER — Emergency Department (HOSPITAL_BASED_OUTPATIENT_CLINIC_OR_DEPARTMENT_OTHER)
Admission: EM | Admit: 2021-09-04 | Discharge: 2021-09-04 | Disposition: A | Discharge status: Home / Self Care | Payer: BC Managed Care – PPO | Attending: Emergency Medicine | Admitting: Emergency Medicine

## 2021-09-04 ENCOUNTER — Other Ambulatory Visit: Payer: Self-pay

## 2021-09-04 ENCOUNTER — Encounter (HOSPITAL_BASED_OUTPATIENT_CLINIC_OR_DEPARTMENT_OTHER): Payer: Self-pay

## 2021-09-04 DIAGNOSIS — I10 Essential (primary) hypertension: Secondary | ICD-10-CM | POA: Diagnosis not present

## 2021-09-04 DIAGNOSIS — Z87891 Personal history of nicotine dependence: Secondary | ICD-10-CM | POA: Insufficient documentation

## 2021-09-04 DIAGNOSIS — R519 Headache, unspecified: Secondary | ICD-10-CM | POA: Diagnosis present

## 2021-09-04 DIAGNOSIS — R110 Nausea: Secondary | ICD-10-CM | POA: Diagnosis not present

## 2021-09-04 LAB — PREGNANCY, URINE: Preg Test, Ur: NEGATIVE

## 2021-09-04 MED ORDER — PROCHLORPERAZINE MALEATE 10 MG PO TABS
10.0000 mg | ORAL_TABLET | Freq: Once | ORAL | Status: AC
  Administered 2021-09-04: 01:00:00 10 mg via ORAL
  Filled 2021-09-04: qty 1

## 2021-09-04 MED ORDER — KETOROLAC TROMETHAMINE 60 MG/2ML IM SOLN
60.0000 mg | Freq: Once | INTRAMUSCULAR | Status: AC
  Administered 2021-09-04: 02:00:00 60 mg via INTRAMUSCULAR
  Filled 2021-09-04: qty 2

## 2021-09-04 NOTE — ED Triage Notes (Signed)
Describes a headache x 4 days OTC meds not helping.  Became nauseous at work and came straight here to be evaluated

## 2021-09-04 NOTE — ED Provider Notes (Signed)
[facility] EMERGENCY DEPT Provider Note   CSN: [id number] Arrival date & time: 09/04/21  0025     History Chief Complaint  Patient presents with   Headache    Monique Keith is a 25 y.o. female.  Patient presents with chief complaint of headache.  Associate with some nausea symptoms ongoing for 4 days.  Patient states she has headache from time to time but typically do not last this long.  Headache is described as gradual onset waxing waning for the past 4 days.  Denies any fevers or cough denies any vomiting or diarrhea.  Denies any neck pain or back pain.      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 06/01/2019    Past Surgical History:  Procedure Laterality Date   CESAREAN SECTION N/A 06/03/2019   Procedure: CESAREAN SECTION;  Surgeon: Gerald Leitz, MD;  Location: [facility] LD ORS;  Service: Obstetrics;  Laterality: N/A;   HERNIA REPAIR     UMBILICAL HERNIA REPAIR       OB History     Gravida  1   Para      Term      Preterm      AB      Living         SAB      IAB      Ectopic      Multiple      Live Births              Family History  Problem Relation Age of Onset   Hypertension Maternal Grandmother    Hypertension Maternal Grandfather    Hyperlipidemia Maternal Grandfather    Diabetes Maternal Grandfather     Social History   Tobacco Use   Smoking status: Former    Packs/day: 0.25    Types: Cigarettes    Quit date: 10/08/2018    Years since quitting: 2.9   Smokeless tobacco: Never  Vaping Use   Vaping Use: Never used  Substance Use Topics   Alcohol use: No     Alcohol/week: 0.0 standard drinks   Drug use: Not Currently    Types: Marijuana    Comment: last use 24 Jan 20    Home Medications Prior to Admission medications   Medication Sig Start Date End Date Taking? Authorizing Provider  benzonatate (TESSALON) 100 MG capsule Take 1 capsule (100 mg total) by mouth every 8 (eight) hours. 07/23/21   Theron Arista, PA-C  celecoxib (CELEBREX) 200 MG capsule Take 1 capsule (200 mg total) by mouth 2 (two) times daily. 08/08/21   Arthor Captain, PA-C    Allergies    Patient has no known allergies.  Review of Systems   Review of Systems  Constitutional:  Negative for fever.  HENT:  Negative for ear pain.   Eyes:  Negative for pain.  Respiratory:  Negative for cough.   Cardiovascular:  Negative for chest pain.  Gastrointestinal:  Negative for abdominal pain.  Genitourinary:  Negative for flank pain.  Musculoskeletal:  Negative for back pain.  Skin:  Negative for rash.  Neurological:  Positive for headaches.   Physical Exam Updated Vital Signs BP (!) 146/93 (BP Location: Right Arm)    Pulse 69    Temp 98.3 F (36.8 C) (Oral)    Resp 18    Ht 5\' 4"  (1.626 m)    Wt 65.8 kg    SpO2 99%    BMI 24.89 kg/m   Physical Exam Constitutional:      General: She is not in acute distress.    Appearance: Normal appearance.  HENT:     Head: Normocephalic.     Nose: Nose normal.  Eyes:     Extraocular Movements: Extraocular movements intact.  Cardiovascular:     Rate and Rhythm: Normal rate.  Pulmonary:     Effort: Pulmonary effort is normal.  Musculoskeletal:        General: Normal range of motion.     Cervical back: Normal range of motion.  Neurological:     General: No focal deficit present.     Mental Status: She is alert. Mental status is at baseline.     Cranial Nerves: No cranial nerve deficit.     Motor: No weakness.     Gait: Gait normal.    ED Results / Procedures / Treatments   Labs (all labs ordered are listed, but only abnormal  results are displayed) Labs Reviewed  PREGNANCY, URINE    EKG None  Radiology No results found.  Procedures Procedures   Medications Ordered in ED Medications  prochlorperazine (COMPAZINE) tablet 10 mg (10 mg Oral Given 09/04/21 0105)  ketorolac (TORADOL) injection 60 mg (60 mg Intramuscular Given 09/04/21 09/06/21)    ED Course  I have reviewed the triage vital signs and the nursing notes.  Pertinent labs & imaging results that were available during my care of the patient were reviewed by me and considered in my medical decision making (see chart for details).    MDM Rules/Calculators/A&P                         Differential included possible subarachnoid hemorrhage versus meningitis.  I feel these are less likely given history and physical exam today, lack of fevers or meningismal signs.  Patient treated with medication here with improvement of symptoms.  Recommending outpatient follow-up with her doctor within the week.  Recommending immediate return for worsening symptoms new numbness weakness or any additional concerns.     Final Clinical Impression(s) / ED Diagnoses Final diagnoses:  Nonintractable headache, unspecified chronicity pattern, unspecified headache type    Rx / DC Orders ED Discharge Orders     None        [id number], MD 09/04/21 [phone]

## 2021-09-04 NOTE — Discharge Instructions (Signed)
Call your primary care doctor or specialist as discussed in the next 2-3 days.   Return immediately back to the ER if:  Your symptoms worsen within the next 12-24 hours. You develop new symptoms such as new fevers, persistent vomiting, new pain, shortness of breath, or new weakness or numbness, or if you have any other concerns.  

## 2021-09-16 ENCOUNTER — Emergency Department (HOSPITAL_COMMUNITY)
Admission: EM | Admit: 2021-09-16 | Discharge: 2021-09-17 | Disposition: A | Discharge status: Home / Self Care | Payer: BC Managed Care – PPO | Attending: Emergency Medicine | Admitting: Emergency Medicine

## 2021-09-16 ENCOUNTER — Other Ambulatory Visit: Payer: Self-pay

## 2021-09-16 ENCOUNTER — Emergency Department (HOSPITAL_COMMUNITY)
Admission: EM | Admit: 2021-09-16 | Discharge: 2021-09-16 | Discharge status: Against Medical Advice | Payer: BC Managed Care – PPO | Source: Home / Self Care

## 2021-09-16 DIAGNOSIS — I10 Essential (primary) hypertension: Secondary | ICD-10-CM | POA: Diagnosis not present

## 2021-09-16 DIAGNOSIS — R0981 Nasal congestion: Secondary | ICD-10-CM | POA: Insufficient documentation

## 2021-09-16 DIAGNOSIS — J3489 Other specified disorders of nose and nasal sinuses: Secondary | ICD-10-CM | POA: Insufficient documentation

## 2021-09-16 DIAGNOSIS — Z87891 Personal history of nicotine dependence: Secondary | ICD-10-CM | POA: Diagnosis not present

## 2021-09-16 DIAGNOSIS — R059 Cough, unspecified: Secondary | ICD-10-CM | POA: Diagnosis not present

## 2021-09-16 DIAGNOSIS — Z20822 Contact with and (suspected) exposure to covid-19: Secondary | ICD-10-CM | POA: Diagnosis not present

## 2021-09-16 NOTE — ED Provider Notes (Signed)
Emergency Medicine Provider Triage Evaluation Note  Monique Keith , a 25 y.o. female  was evaluated in triage.  Pt complains of cough, headache, rhinorrhea, congestion.  Patient also notes some mild chest pain when coughing.  No shortness of breath.  No nausea, vomiting, diarrhea.  Physical Exam  BP (!) 135/98 (BP Location: Right Arm)    Pulse 68    Temp 98.6 F (37 C) (Oral)    Resp 20    Ht 5\' 4"  (1.626 m)    Wt 65.8 kg    SpO2 100%    BMI 24.89 kg/m  Gen:   Awake, no distress   Resp:  Normal effort MSK:   Moves extremities without difficulty  Other:    Medical Decision Making  Medically screening exam initiated at 11:45 PM.  Appropriate orders placed.  Monique Keith was informed that the remainder of the evaluation will be completed by another provider, this initial triage assessment does not replace that evaluation, and the importance of remaining in the ED until their evaluation is complete.   Monique Proctor, PA-C 09/16/21 2345    09/18/21, MD 09/17/21 [phone]

## 2021-09-16 NOTE — ED Triage Notes (Signed)
Patient reports she believes she has covid, has had headache, runny nose, congestion x 3 days. Denies n/v/d, denies fevers. Denies sick contacts.

## 2021-09-17 LAB — RESP PANEL BY RT-PCR (FLU A&B, COVID) ARPGX2
Influenza A by PCR: NEGATIVE
Influenza B by PCR: NEGATIVE
SARS Coronavirus 2 by RT PCR: NEGATIVE

## 2021-09-17 MED ORDER — MOMETASONE FUROATE 50 MCG/ACT NA SUSP: 2.0000 | Freq: Every day | NASAL | 0 refills | Status: DC

## 2021-09-17 MED ORDER — GUAIFENESIN ER 600 MG PO TB12: 600.0000 mg | ORAL_TABLET | Freq: Two times a day (BID) | ORAL | 0 refills | Status: DC

## 2021-09-17 NOTE — Discharge Instructions (Signed)
Take the prescribed medication as directed.  Rest, hydrate at home. Consider repeat covid testing if symptoms worsening or developing fever. Return to the ED for new or worsening symptoms.

## 2021-09-17 NOTE — ED Provider Notes (Signed)
[facility]-EMERGENCY DEPT Provider Note   CSN: [id number] Arrival date & time: 09/16/21  2315     History Chief Complaint  Patient presents with   covid symptoms    Monique Keith is a 25 y.o. female.  The history is provided by the patient and medical records.   25 year old female presenting to the ED with concern of COVID-19.  She has had cough, headache, rhinorrhea, and nasal congestion over the past several days.  She denies any shortness of breath.  No nausea, vomiting, or diarrhea.  She has been able to eat and drink.  Denies fever.  No sick contacts.  No meds PTA.  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 06/01/2019    Past Surgical History:  Procedure Laterality Date   CESAREAN SECTION N/A 06/03/2019   Procedure: CESAREAN SECTION;  Surgeon: Gerald Leitz, MD;  Location: [facility] LD ORS;  Service: Obstetrics;  Laterality: N/A;   HERNIA REPAIR     UMBILICAL HERNIA REPAIR       OB History     Gravida  1   Para      Term      Preterm      AB      Living         SAB      IAB      Ectopic      Multiple      Live Births              Family History  Problem Relation Age of Onset   Hypertension Maternal Grandmother    Hypertension Maternal Grandfather    Hyperlipidemia Maternal Grandfather    Diabetes Maternal Grandfather     Social History   Tobacco Use   Smoking status: Former    Packs/day: 0.25    Types: Cigarettes    Quit date: 10/08/2018    Years since quitting: 2.9   Smokeless tobacco: Never  Vaping Use   Vaping Use: Never used  Substance Use Topics    Alcohol use: No    Alcohol/week: 0.0 standard drinks   Drug use: Not Currently    Types: Marijuana    Comment: last use 24 Jan 20    Home Medications Prior to Admission medications   Medication Sig Start Date End Date Taking? Authorizing Provider  guaiFENesin (MUCINEX) 600 MG 12 hr tablet Take 1 tablet (600 mg total) by mouth 2 (two) times daily. 09/17/21  Yes Garlon Hatchet, PA-C  mometasone (NASONEX) 50 MCG/ACT nasal spray Place 2 sprays into the nose daily. 09/17/21  Yes Garlon Hatchet, PA-C  benzonatate (TESSALON) 100 MG capsule Take 1 capsule (100 mg total) by mouth every 8 (eight) hours. 07/23/21   Theron Arista, PA-C  celecoxib (CELEBREX) 200 MG capsule Take 1 capsule (200 mg total) by mouth 2 (two) times daily. 08/08/21   Arthor Captain, PA-C    Allergies    Patient has no known allergies.  Review of Systems   Review of Systems  HENT:  Positive for congestion.   Respiratory:  Positive for cough.  All other systems reviewed and are negative.  Physical Exam Updated Vital Signs BP (!) 135/98 (BP Location: Right Arm)    Pulse 68    Temp 98.6 F (37 C) (Oral)    Resp 20    Ht 5\' 4"  (1.626 m)    Wt 65.8 kg    SpO2 100%    BMI 24.89 kg/m   Physical Exam Vitals and nursing note reviewed.  Constitutional:      Appearance: She is well-developed.  HENT:     Head: Normocephalic and atraumatic.     Right Ear: Tympanic membrane and ear canal normal.     Left Ear: Tympanic membrane and ear canal normal.     Nose: Congestion and rhinorrhea present. Rhinorrhea is clear.     Mouth/Throat:     Lips: Pink.     Mouth: Mucous membranes are moist.     Pharynx: Oropharynx is clear. Uvula midline.  Eyes:     Conjunctiva/sclera: Conjunctivae normal.     Pupils: Pupils are equal, round, and reactive to light.  Cardiovascular:     Rate and Rhythm: Normal rate and regular rhythm.     Heart sounds: Normal heart sounds.  Pulmonary:     Effort: Pulmonary effort is normal.     Breath  sounds: Normal breath sounds.  Abdominal:     General: Bowel sounds are normal.     Palpations: Abdomen is soft.  Musculoskeletal:        General: Normal range of motion.     Cervical back: Normal range of motion.  Skin:    General: Skin is warm and dry.  Neurological:     Mental Status: She is alert and oriented to person, place, and time.    ED Results / Procedures / Treatments   Labs (all labs ordered are listed, but only abnormal results are displayed) Labs Reviewed  RESP PANEL BY RT-PCR (FLU A&B, COVID) ARPGX2    EKG None  Radiology No results found.  Procedures Procedures   Medications Ordered in ED Medications - No data to display  ED Course  I have reviewed the triage vital signs and the nursing notes.  Pertinent labs & imaging results that were available during my care of the patient were reviewed by me and considered in my medical decision making (see chart for details).    MDM Rules/Calculators/A&P                         25 y.o. F here with 3 days of URI symptoms.  Congestion and rhinorrhea on exam but otherwise reassuring.  VSS.  Covid/flu screening negative.  Advised to monitor symptoms, may need to re-test if worsening or developing fever.  Symptomatic care for now, rest, hydrate.  Follow-up with PCP.  Work note given.  Final Clinical Impression(s) / ED Diagnoses Final diagnoses:  Nasal congestion    Rx / DC Orders ED Discharge Orders          Ordered    guaiFENesin (MUCINEX) 600 MG 12 hr tablet  2 times daily        09/17/21 0157    mometasone (NASONEX) 50 MCG/ACT nasal spray  Daily        09/17/21 0157             09/19/21, PA-C 09/17/21 0201    09/19/21, MD 09/17/21 [phone]

## 2021-11-03 ENCOUNTER — Telehealth: Payer: Self-pay | Admitting: *Deleted

## 2021-11-03 NOTE — Patient Outreach (Signed)
Care Coordination  11/03/2021  Monique Keith 1996-07-26 [id number]  Successful outreach with [name] today. RNCM provided information re: Case Management/Care Coordination as a free benefit of her insurance. [name] is interested in Case Management/Care Coordination and would like to schedule an appointment for a telephone visit. An appointment was agreed on for 11/11/21 @ 9am.  Lurena Joiner RN, BSN [place] RN Care Coordinator

## 2021-11-10 ENCOUNTER — Other Ambulatory Visit: Payer: Self-pay | Admitting: *Deleted

## 2021-11-10 NOTE — Patient Outreach (Signed)
Care Coordination  11/10/2021  Daylan Boggess Jul 24, 1996 [id number]   Medicaid Managed Care   Unsuccessful Outreach Note  11/10/2021 Name: Elliemae Braman MRN: [id number] DOB: 1996/06/04  Referred by: Patient, No Pcp Per (Inactive) Reason for referral : High Risk Managed Medicaid (Unsuccessful RNCM initial telephone outreach)   An unsuccessful telephone outreach was attempted today. The patient was referred to the case management team for assistance with care management and care coordination.   Follow Up Plan: The care management team will reach out to the patient again over the next 14 days.   Estanislado Emms RN, BSN [place]   [facility]

## 2021-11-10 NOTE — Patient Instructions (Signed)
Visit Information ? ?[name]  - as a part of your Medicaid benefit, you are eligible for care management and care coordination services at no cost or copay. I was unable to reach you by phone today but would be happy to help you with your health related needs. Please feel free to call me @ [phone].  ? ?A member of the Managed Medicaid care management team will reach out to you again over the next 14 days.  ? ?Capria Cartaya RN, BSN ?[place]  [facility] ?RN Care Coordinator ?  ?

## 2021-11-28 ENCOUNTER — Other Ambulatory Visit: Payer: Self-pay | Admitting: *Deleted

## 2021-11-28 ENCOUNTER — Other Ambulatory Visit: Payer: Self-pay

## 2021-11-28 NOTE — Patient Outreach (Signed)
?Medicaid Managed Care   ?Nurse Care Manager Note ? ?11/28/2021 ?Name:  Monique Keith MRN:  [id number] DOB:  [birth year] ? ?Monique Keith is an 26 y.o. year old female who is a primary patient of Patient, No Pcp Per (Inactive).  The [facility] Managed Care Coordination team was consulted for assistance with:    ?Health Maintenance ? ?[name] was given information about Medicaid Managed Care Coordination team services today. Drucie Ip Patient agreed to services and verbal consent obtained. ? ?Engaged with patient by telephone for follow up visit in response to provider referral for case management and/or care coordination services.  ? ?Assessments/Interventions:  Review of past medical history, allergies, medications, health status, including review of consultants reports, laboratory and other test data, was performed as part of comprehensive evaluation and provision of chronic care management services. ? ?SDOH (Social Determinants of Health) assessments and interventions performed: ? ? ?Care Plan ? ?No Known Allergies ? ?Medications Reviewed Today   ? ? Reviewed by Melissa Montane, RN (Registered Nurse) on 11/28/21 at 1539  Med List Status: <None>  ? ?Medication Order Taking? Sig Documenting Provider Last Dose Status Informant  ?benzonatate (TESSALON) 100 MG capsule [id number] No Take 1 capsule (100 mg total) by mouth every 8 (eight) hours.  ?Patient not taking: Reported on 11/28/2021  ? Sherrill Raring, PA-C Not Taking Active   ?celecoxib (CELEBREX) 200 MG capsule [id number] No Take 1 capsule (200 mg total) by mouth 2 (two) times daily.  ?Patient not taking: Reported on 11/28/2021  ? Margarita Mail, PA-C Not Taking Active   ?guaiFENesin (MUCINEX) 600 MG 12 hr tablet [id number] No Take 1 tablet (600 mg total) by mouth 2 (two) times daily.  ?Patient not taking: Reported on 11/28/2021  ? Larene Pickett, PA-C Not Taking Active   ?mometasone (NASONEX) 50 MCG/ACT nasal spray [id number] No Place 2 sprays into the nose daily.   ?Patient not taking: Reported on 11/28/2021  ? Larene Pickett, PA-C Not Taking Active   ? ?  ?  ? ?  ? ? ?Patient Active Problem List  ? Diagnosis Date Noted  ? Cesarean delivery delivered - arrest of dilation 5 cm 06/03/2019  ? Postpartum care following cesarean delivery 9/15 06/03/2019  ? Encounter for induction of labor 06/01/2019  ? IUGR (intrauterine growth restriction) affecting care of mother 06/01/2019  ? ? ?Conditions to be addressed/monitored per PCP order:   Health Maintenance ? ?Care Plan : RN Care Manager Plan of Care  ?Updates made by Melissa Montane, RN since 11/28/2021 12:00 AM  ?  ? ?Problem: Health Management needs related to Health Maintenance   ?  ? ?Goal: Development of Plan of Care to address Health Management needs related to Health Maintenance   ?Start Date: 11/28/2021  ?Expected End Date: 12/28/2021  ?Priority: High  ?Note:   ?Current Barriers:  ?Knowledge Deficits related to plan of care for management of Health Maintenance  ? ?RNCM Clinical Goal(s):  ?Patient will verbalize understanding of plan for management of Health Maintenance as evidenced by documentation in EMR ?work with [employer] care guide to address needs related to Limited education about finding providers accepting Medicaid* as evidenced by patient and/or community resource care guide support    through collaboration with Consulting civil engineer, provider, and care team.  ? ?Interventions: ?Inter-disciplinary care team collaboration (see longitudinal plan of care) ?Evaluation of current treatment plan related to  self management and patient's adherence to plan as established by provider ? ? ?Health  Maintenance  (Status: New goal.) Short Term Goal  ?Evaluation of current treatment plan related to  Health Maintenance ,  lacks knowledge of procuring PCP  self-management and patient's adherence to plan as established by provider. ?Discussed plans with patient for ongoing care management follow up and provided patient with direct  contact information for care management team ?Advised patient to call MD referral line [phone] to find a provider accepting new patients; ?Provided education to patient re: healthy diet; ?Care Guide referral for assistance with finding provider for Eye Exam; ?Discussed plans with patient for ongoing care management follow up and provided patient with direct contact information for care management team; ? ?Patient Goals/Self-Care Activities: ?Contact MD referral line [phone] to find a Primary Care Provider accepting new patients ?Work with Care Guide to find a provider for an Eye Exam ? ? ?  ? ? ?Follow Up:  Patient agrees to Care Plan and Follow-up. ? ?Plan: The Managed Medicaid care management team will reach out to the patient again over the next 30 days. ? ?Date/time of next scheduled RN care management/care coordination outreach:  12/29/21 @ 10:30am ? ?Lurena Joiner RN, BSN ?[facility] ?RN Care Coordinator ? ?

## 2021-11-28 NOTE — Patient Instructions (Signed)
Visit Information ? ?Ms. Bonello was given information about Medicaid Managed Care team care coordination services as a part of their Healthy [facility] Medicaid benefit. Alpha Chouinard verbally consented to engagement with the [facility] Managed Care team.  ? ?If you are experiencing a medical emergency, please call 911 or report to your local emergency department or urgent care.  ? ?If you have a non-emergency medical problem during routine business hours, please contact your provider's office and ask to speak with a nurse.  ? ?For questions related to your Healthy [facility] health plan, please call: [phone] or visit the homepage here: [email or web address] ? ?If you would like to schedule transportation through your Healthy [facility] plan, please call the following number at least 2 days in advance of your appointment: [phone] ? For information about your ride after you set it up, call Ride Assist at [phone]. Use this number to activate a Will Call pickup, or if your transportation is late for a scheduled pickup. Use this number, too, if you need to make a change or cancel a previously scheduled reservation. ? If you need transportation services right away, call [phone]. The after-hours call center is staffed 24 hours to handle ride assistance and urgent reservation requests (including discharges) 365 days a year. Urgent trips include sick visits, hospital discharge requests and life-sustaining treatment. ? ?Call the [facility] Line at [phone], at any time, 24 hours a day, 7 days a week. If you are in danger or need immediate medical attention call 911. ? ?If you would like help to quit smoking, call 1-800-QUIT-NOW (937-249-1433) OR Espa?ol: 1-855-D?jelo-Ya 814-096-1920) o para m?s informaci?n haga clic aqu? or Text READY to 200-400 to register via text ? ?Ms. Manson Passey, ? ? ?Please see education materials related to healthy diet provided by  MyChart link. ? ?Patient verbalizes understanding of instructions and care plan provided today and agrees to view in MyChart. Active MyChart status confirmed with patient.   ? ?Telephone follow up appointment with Managed Medicaid care management team member scheduled for:12/29/21 @ 10:30am ? ?Estanislado Emms RN, BSN ?[place]  [facility] ?RN Care Coordinator ? ? ?Following is a copy of your plan of care:  ?Care Plan : RN Care Manager Plan of Care  ?Updates made by Heidi Dach, RN since 11/28/2021 12:00 AM  ?  ? ?Problem: Health Management needs related to Health Maintenance   ?  ? ?Goal: Development of Plan of Care to address Health Management needs related to Health Maintenance   ?Start Date: 11/28/2021  ?Expected End Date: 12/28/2021  ?Priority: High  ?Note:   ?Current Barriers:  ?Knowledge Deficits related to plan of care for management of Health Maintenance  ? ?RNCM Clinical Goal(s):  ?Patient will verbalize understanding of plan for management of Health Maintenance as evidenced by documentation in EMR ?work with [employer] care guide to address needs related to Limited education about finding providers accepting Medicaid* as evidenced by patient and/or community resource care guide support    through collaboration with Medical illustrator, provider, and care team.  ? ?Interventions: ?Inter-disciplinary care team collaboration (see longitudinal plan of care) ?Evaluation of current treatment plan related to  self management and patient's adherence to plan as established by provider ? ? ?Health Maintenance  (Status: New goal.) Short Term Goal  ?Evaluation of current treatment plan related to  Health Maintenance ,  lacks knowledge of procuring PCP  self-management and patient's adherence to plan as established by provider. ?Discussed plans  with patient for ongoing care management follow up and provided patient with direct contact information for care management team ?Advised patient to call MD  referral line [phone] to find a provider accepting new patients; ?Provided education to patient re: healthy diet; ?Care Guide referral for assistance with finding provider for Eye Exam; ?Discussed plans with patient for ongoing care management follow up and provided patient with direct contact information for care management team; ? ?Patient Goals/Self-Care Activities: ?Contact MD referral line [phone] to find a Primary Care Provider accepting new patients ?Work with Care Guide to find a provider for an Eye Exam ? ? ?  ? ?  ?

## 2021-12-16 DIAGNOSIS — Z3042 Encounter for surveillance of injectable contraceptive: Secondary | ICD-10-CM | POA: Diagnosis not present

## 2021-12-29 ENCOUNTER — Other Ambulatory Visit: Payer: Self-pay | Admitting: *Deleted

## 2021-12-29 NOTE — Patient Outreach (Signed)
?Medicaid Managed Care   ?Nurse Care Manager Note ? ?12/29/2021 ?Name:  Monique Keith MRN:  [id number] DOB:  January 14, 1996 ? ?Monique Keith is an 26 y.o. year old female who is a primary patient of Patient, No Pcp Per (Inactive).  The [facility] Managed Care Coordination team was consulted for assistance with:    ?Health Maintenance ? ?Ms. Friesz was given information about Medicaid Managed Care Coordination team services today. Shelbie Proctor Patient agreed to services and verbal consent obtained. ? ?Engaged with patient by telephone for follow up visit in response to provider referral for case management and/or care coordination services.  ? ?Assessments/Interventions:  Review of past medical history, allergies, medications, health status, including review of consultants reports, laboratory and other test data, was performed as part of comprehensive evaluation and provision of chronic care management services. ? ?SDOH (Social Determinants of Health) assessments and interventions performed: ?SDOH Interventions   ? ?Flowsheet Row Most Recent Value  ?SDOH Interventions   ?Food Insecurity Interventions Intervention Not Indicated  ?Housing Interventions Intervention Not Indicated  ?Transportation Interventions Intervention Not Indicated  ? ?  ? ? ?Care Plan ? ?No Known Allergies ? ?Medications Reviewed Today   ? ? Reviewed by Heidi Dach, RN (Registered Nurse) on 12/29/21 at 1041  Med List Status: <None>  ? ?Medication Order Taking? Sig Documenting Provider Last Dose Status Informant  ?benzonatate (TESSALON) 100 MG capsule [id number] No Take 1 capsule (100 mg total) by mouth every 8 (eight) hours.  ?Patient not taking: Reported on 11/28/2021  ? Theron Arista, PA-C Not Taking Active   ?celecoxib (CELEBREX) 200 MG capsule [id number] No Take 1 capsule (200 mg total) by mouth 2 (two) times daily.  ?Patient not taking: Reported on 11/28/2021  ? Arthor Captain, PA-C Not Taking Active   ?guaiFENesin (MUCINEX) 600 MG 12 hr tablet  [id number] No Take 1 tablet (600 mg total) by mouth 2 (two) times daily.  ?Patient not taking: Reported on 11/28/2021  ? Garlon Hatchet, PA-C Not Taking Active   ?mometasone (NASONEX) 50 MCG/ACT nasal spray [id number] No Place 2 sprays into the nose daily.  ?Patient not taking: Reported on 11/28/2021  ? Garlon Hatchet, PA-C Not Taking Active   ? ?  ?  ? ?  ? ? ?Patient Active Problem List  ? Diagnosis Date Noted  ? Cesarean delivery delivered - arrest of dilation 5 cm 06/03/2019  ? Postpartum care following cesarean delivery 9/15 06/03/2019  ? Encounter for induction of labor 06/01/2019  ? IUGR (intrauterine growth restriction) affecting care of mother 06/01/2019  ? ? ?Conditions to be addressed/monitored per PCP order:   Health Maintenance ? ?Care Plan : RN Care Manager Plan of Care  ?Updates made by Heidi Dach, RN since 12/29/2021 12:00 AM  ?  ? ?Problem: Health Management needs related to Health Maintenance   ?  ? ?Goal: Development of Plan of Care to address Health Management needs related to Health Maintenance   ?Start Date: 11/28/2021  ?Expected End Date: 01/30/2022  ?Priority: High  ?Note:   ?Current Barriers:  ?Knowledge Deficits related to plan of care for management of Health Maintenance Ms. Skalla has not scheduled a new patient visit with a provider. She was unable to see the information provided in MyChart. She was having work done at her home and was only able to have a brief conversation with RNCM today. ? ?RNCM Clinical Goal(s):  ?Patient will verbalize understanding of plan for management of Health Maintenance as evidenced by  documentation in EMR ?work with [employer] care guide to address needs related to Limited education about finding providers accepting Medicaid* as evidenced by patient and/or community resource care guide support    through collaboration with Medical illustrator, provider, and care team.  ? ?Interventions: ?Inter-disciplinary care team collaboration (see longitudinal plan of  care) ?Evaluation of current treatment plan related to  self management and patient's adherence to plan as established by provider ?RNCM will send information in the mail ?Discussed the benefits and importance of finding a PCP ? ? ?Health Maintenance  (Status: New goal.) Short Term Goal  ?Evaluation of current treatment plan related to  Health Maintenance ,  lacks knowledge of procuring PCP  self-management and patient's adherence to plan as established by provider. ?Discussed plans with patient for ongoing care management follow up and provided patient with direct contact information for care management team ?Advised patient to call MD referral line [phone] or go online to [facility] Find a Provider, to find a provider accepting new patients; ?Provided education to patient re: healthy diet; ?Discussed plans with patient for ongoing care management follow up and provided patient with direct contact information for care management team; ?Call Healthy [facility]  [phone] for Ophthalmologist in network to schedule an eye exam ? ?Patient Goals/Self-Care Activities: ?Contact MD referral line [phone] or go online to [facility] Find a Provider to find a Primary Care Provider accepting new patients ?Work with Care Guide to find a provider for an Eye Exam ? ? ?  ? ? ?Follow Up:  Patient agrees to Care Plan and Follow-up. ? ?Plan: The Managed Medicaid care management team will reach out to the patient again over the next 30 days. ? ?Date/time of next scheduled RN care management/care coordination outreach:  01/30/22 @ 10:30am ? ?Estanislado Emms RN, BSN ?[place]  [facility] ?RN Care Coordinator ? ?

## 2021-12-29 NOTE — Patient Instructions (Signed)
Visit Information ? ?Monique Keith was given information about Medicaid Managed Care team care coordination services as a part of their Healthy [facility] Medicaid benefit. Monique Keith verbally consented to engagement with the [facility] Managed Care team.  ? ?If you are experiencing a medical emergency, please call 911 or report to your local emergency department or urgent care.  ? ?If you have a non-emergency medical problem during routine business hours, please contact your provider's office and ask to speak with a nurse.  ? ?For questions related to your Healthy [facility] health plan, please call: [phone] or visit the homepage here: [email or web address] ? ?If you would like to schedule transportation through your Healthy [facility] plan, please call the following number at least 2 days in advance of your appointment: [phone] ? For information about your ride after you set it up, call Ride Assist at [phone]. Use this number to activate a Will Call pickup, or if your transportation is late for a scheduled pickup. Use this number, too, if you need to make a change or cancel a previously scheduled reservation. ? If you need transportation services right away, call [phone]. The after-hours call center is staffed 24 hours to handle ride assistance and urgent reservation requests (including discharges) 365 days a year. Urgent trips include sick visits, hospital discharge requests and life-sustaining treatment. ? ?Call the [place] at [phone], at any time, 24 hours a day, 7 days a week. If you are in danger or need immediate medical attention call 911. ? ?If you would like help to quit smoking, call 1-800-QUIT-NOW (718)686-0844) OR Espa?ol: 1-855-D?jelo-Ya (636)751-1821) o para m?s informaci?n haga clic aqu? or Text READY to 200-400 to register via text ? ?Monique Keith, ? ? ?Please see education materials related to Health Maintenance  provided by MyChart link. and as [employer] account planner.  ? ?The patient verbalized understanding of instructions, educational materials, and care plan provided today and agreed to receive a mailed copy of patient instructions, educational materials, and care plan.  ? ?Telephone follow up appointment with Managed Medicaid care management team member scheduled for:01/30/22 @ 10:30am ? ?Lurena Joiner RN, BSN ?[place] ?RN Care Coordinator ? ? ?Following is a copy of your plan of care:  ?Care Plan : [place] of Care  ?Updates made by Melissa Montane, RN since 12/29/2021 12:00 AM  ?  ? ?Problem: Health Management needs related to Health Maintenance   ?  ? ?Goal: Development of Plan of Care to address Health Management needs related to Health Maintenance   ?Start Date: 11/28/2021  ?Expected End Date: 01/30/2022  ?Priority: High  ?Note:   ?Current Barriers:  ?Knowledge Deficits related to plan of care for management of Health Maintenance Monique Keith has not scheduled a new patient visit with a provider. She was unable to see the information provided in MyChart. She was having work done at her home and was only able to have a brief conversation with RNCM today. ? ?RNCM Clinical Goal(s):  ?Patient will verbalize understanding of plan for management of Health Maintenance as evidenced by documentation in EMR ?work with [employer] care guide to address needs related to Limited education about finding providers accepting Medicaid* as evidenced by patient and/or community resource care guide support    through collaboration with Consulting civil engineer, provider, and care team.  ? ?Interventions: ?Inter-disciplinary care team collaboration (see longitudinal plan of care) ?Evaluation of current treatment plan related to  self management  and patient's adherence to plan as established by provider ?RNCM will send information in the mail ?Discussed the benefits and importance of finding a PCP ? ? ?Health  Maintenance  (Status: New goal.) Short Term Goal  ?Evaluation of current treatment plan related to  Health Maintenance ,  lacks knowledge of procuring PCP  self-management and patient's adherence to plan as established by provider. ?Discussed plans with patient for ongoing care management follow up and provided patient with direct contact information for care management team ?Advised patient to call MD referral line [phone] or go online to [place] a Provider, to find a provider accepting new patients; ?Provided education to patient re: healthy diet; ?Discussed plans with patient for ongoing care management follow up and provided patient with direct contact information for care management team; ?Call Healthy [facility]  [phone] for Ophthalmologist in network to schedule an eye exam ? ?Patient Goals/Self-Care Activities: ?Contact MD referral line [phone] or go online to [place] a Provider to find a Primary Care Provider accepting new patients ?Work with Care Guide to find a provider for an Eye Exam ? ? ?  ? ?  ?

## 2022-01-11 ENCOUNTER — Other Ambulatory Visit: Payer: Self-pay

## 2022-01-11 ENCOUNTER — Emergency Department (HOSPITAL_BASED_OUTPATIENT_CLINIC_OR_DEPARTMENT_OTHER)
Admission: EM | Admit: 2022-01-11 | Discharge: 2022-01-11 | Disposition: A | Discharge status: Home / Self Care | Payer: BC Managed Care – PPO | Attending: Emergency Medicine | Admitting: Emergency Medicine

## 2022-01-11 ENCOUNTER — Encounter (HOSPITAL_BASED_OUTPATIENT_CLINIC_OR_DEPARTMENT_OTHER): Payer: Self-pay

## 2022-01-11 ENCOUNTER — Emergency Department (HOSPITAL_BASED_OUTPATIENT_CLINIC_OR_DEPARTMENT_OTHER): Payer: BC Managed Care – PPO | Admitting: Radiology

## 2022-01-11 DIAGNOSIS — S6991XA Unspecified injury of right wrist, hand and finger(s), initial encounter: Secondary | ICD-10-CM | POA: Diagnosis present

## 2022-01-11 DIAGNOSIS — M79641 Pain in right hand: Secondary | ICD-10-CM

## 2022-01-11 DIAGNOSIS — S60221A Contusion of right hand, initial encounter: Secondary | ICD-10-CM | POA: Diagnosis not present
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
## 2022-01-11 NOTE — Discharge Instructions (Addendum)
Please use Tylenol or ibuprofen for pain.  You may use 600 mg ibuprofen every 6 hours or 1000 mg of Tylenol every 6 hours.  You may choose to alternate between the 2.  This would be most effective.  Not to exceed 4 g of Tylenol within 24 hours.  Not to exceed 3200 mg ibuprofen 24 hours. ? ?You can use ice up to 15 minutes at a time. ?

## 2022-01-12 ENCOUNTER — Telehealth: Payer: Self-pay

## 2022-01-12 NOTE — Telephone Encounter (Signed)
Transition Care Management Follow-up Telephone Call ?Date of discharge and from where: 01/11/2022 from [place] ?How have you been since you were released from the hospital? Patient stated that she is feeling better and did not have any questions or concerns at this time.  ?Any questions or concerns? No ? ?Items Reviewed: ?Did the pt receive and understand the discharge instructions provided? Yes  ?Medications obtained and verified? Yes  ?Other? No  ?Any new allergies since your discharge? No  ?Dietary orders reviewed? No ?Do you have support at home? Yes  ? ?Functional Questionnaire: (I = Independent and D = Dependent) ?ADLs: I ? ?Bathing/Dressing- I ? ?Meal Prep- I ? ?Eating- I ? ?Maintaining continence- I ? ?Transferring/Ambulation- I ? ?Managing Meds- I ? ? ?Follow up appointments reviewed: ?PCP Hospital f/u appt confirmed? No   ?Specialist Hospital f/u appt confirmed? No   ?Are transportation arrangements needed? No  ?If their condition worsens, is the pt aware to call PCP or go to the Emergency Dept.? Yes ?Was the patient provided with contact information for the PCP's office or ED? Yes ?Was to pt encouraged to call back with questions or concerns? Yes ? ?

## 2022-01-30 ENCOUNTER — Other Ambulatory Visit: Payer: Self-pay | Admitting: *Deleted

## 2022-01-30 NOTE — Patient Outreach (Signed)
Care Coordination ? ?01/30/2022 ? ?Jeana Kersting ?September 05, 1996 ?[id number] ? ? ?Medicaid Managed Care  ? ?Unsuccessful Outreach Note ? ?01/30/2022 ?Name: Monique Keith MRN: [id number] DOB: [birth year] ? ?Referred by: Patient, No Pcp Per (Inactive) ?Reason for referral : High Risk Managed Medicaid (Unsuccessful RNCM follow up telephone outreach) ? ? ?An unsuccessful telephone outreach was attempted today. The patient was referred to the case management team for assistance with care management and care coordination.  ? ?Follow Up Plan: The care management team will reach out to the patient again over the next 14 days.  ? ?Estanislado Emms RN, BSN ?[place]  [facility] ?RN Care Coordinator ? ? ?

## 2022-01-30 NOTE — Patient Instructions (Signed)
Visit Information ? ?[name]  - as a part of your Medicaid benefit, you are eligible for care management and care coordination services at no cost or copay. I was unable to reach you by phone today but would be happy to help you with your health related needs. Please feel free to call me @ [phone].  ? ?A member of the Managed Medicaid care management team will reach out to you again over the next 14 days.  ? ?Estanislado Emms RN, BSN ?[place]  [facility] ?RN Care Coordinator ?  ?

## 2022-02-02 ENCOUNTER — Other Ambulatory Visit: Payer: Self-pay | Admitting: *Deleted

## 2022-02-02 NOTE — Patient Instructions (Signed)
Visit Information ? ?Ms. Mendi Dolloff  - as a part of your Medicaid benefit, you are eligible for care management and care coordination services at no cost or copay. I was unable to reach you by phone today but would be happy to help you with your health related needs. Please feel free to call me @ [phone].  ? ?A member of the Managed Medicaid care management team will reach out to you again over the next 14 days.  ? ?Taysen Bushart RN, BSN ?[place]  [facility] ?RN Care Coordinator ?  ?

## 2022-02-02 NOTE — Patient Outreach (Signed)
Care Coordination  02/02/2022  Monique Keith [birth year] [id number]   Medicaid Managed Care   Unsuccessful Outreach Note  02/02/2022 Name: Monique Keith MRN: [id number] DOB: 1996-05-22  Referred by: Patient, No Pcp Per (Inactive) Reason for referral : High Risk Managed Medicaid (Unsuccessful RNCM follow up outreach, 2nd attempt)   A second unsuccessful telephone outreach was attempted today. The patient was referred to the case management team for assistance with care management and care coordination.   Follow Up Plan: The care management team will reach out to the patient again over the next 14 days.   Lurena Joiner RN, BSN [place]  [employer] [facility]

## 2022-02-20 ENCOUNTER — Other Ambulatory Visit: Payer: Self-pay | Admitting: *Deleted

## 2022-02-20 NOTE — Patient Outreach (Signed)
Care Coordination  02/20/2022  Monique Keith 1996-07-23 [id number]   Medicaid Managed Care   Unsuccessful Outreach Note  02/20/2022 Name: Monique Keith MRN: [id number] DOB: September 02, 1996  Referred by: Patient, No Pcp Per (Inactive) Reason for referral : High Risk Managed Medicaid (Unsuccessful RNCM follow up telephone outreach, 3rd attempt)   Third unsuccessful telephone outreach was attempted today. The patient was referred to the case management team for assistance with care management and care coordination. The patient's primary care provider has been notified of our unsuccessful attempts to make or maintain contact with the patient. The care management team is pleased to engage with this patient at any time in the future should he/she be interested in assistance from the care management team.   Follow Up Plan: We have been unable to make contact with the patient for follow up. The care management team is available to follow up with the patient after provider conversation with the patient regarding recommendation for care management engagement and subsequent re-referral to the care management team.   Estanislado Emms RN, BSN [place]  [facility] RN Care Coordinator

## 2022-04-20 DIAGNOSIS — M25511 Pain in right shoulder: Secondary | ICD-10-CM | POA: Diagnosis not present

## 2022-10-28 ENCOUNTER — Emergency Department (HOSPITAL_BASED_OUTPATIENT_CLINIC_OR_DEPARTMENT_OTHER)
Admission: EM | Admit: 2022-10-28 | Discharge: 2022-10-28 | Disposition: A | Discharge status: Home / Self Care | Payer: Medicaid Other | Attending: Emergency Medicine | Admitting: Emergency Medicine

## 2022-10-28 ENCOUNTER — Encounter (HOSPITAL_BASED_OUTPATIENT_CLINIC_OR_DEPARTMENT_OTHER): Payer: Self-pay | Admitting: Emergency Medicine

## 2022-10-28 DIAGNOSIS — I10 Essential (primary) hypertension: Secondary | ICD-10-CM | POA: Diagnosis not present

## 2022-10-28 DIAGNOSIS — R112 Nausea with vomiting, unspecified: Secondary | ICD-10-CM | POA: Insufficient documentation

## 2022-10-28 DIAGNOSIS — Z20822 Contact with and (suspected) exposure to covid-19: Secondary | ICD-10-CM | POA: Insufficient documentation

## 2022-10-28 DIAGNOSIS — R110 Nausea: Secondary | ICD-10-CM | POA: Diagnosis not present

## 2022-10-28 DIAGNOSIS — G4489 Other headache syndrome: Secondary | ICD-10-CM | POA: Diagnosis not present

## 2022-10-28 DIAGNOSIS — R1111 Vomiting without nausea: Secondary | ICD-10-CM | POA: Diagnosis not present

## 2022-10-28 DIAGNOSIS — R519 Headache, unspecified: Secondary | ICD-10-CM | POA: Diagnosis not present

## 2022-10-28 LAB — COMPREHENSIVE METABOLIC PANEL
ALT: 8 U/L (ref 0–44)
AST: 12 U/L — ABNORMAL LOW (ref 15–41)
Albumin: 4.5 g/dL (ref 3.5–5.0)
Alkaline Phosphatase: 54 U/L (ref 38–126)
Anion gap: 12 (ref 5–15)
BUN: 13 mg/dL (ref 6–20)
CO2: 20 mmol/L — ABNORMAL LOW (ref 22–32)
Calcium: 9.2 mg/dL (ref 8.9–10.3)
Chloride: 107 mmol/L (ref 98–111)
Creatinine, Ser: 0.78 mg/dL (ref 0.44–1.00)
GFR, Estimated: >60 mL/min (ref >60)
Glucose, Bld: 78 mg/dL (ref 70–99)
Potassium: 3.7 mmol/L (ref 3.5–5.1)
Sodium: 139 mmol/L (ref 135–145)
Total Bilirubin: 0.8 mg/dL (ref 0.3–1.2)
Total Protein: 7.6 g/dL (ref 6.5–8.1)

## 2022-10-28 LAB — CBC
HCT: 38.7 % (ref 36.0–46.0)
Hemoglobin: 12.7 g/dL (ref 12.0–15.0)
MCH: 27.4 pg (ref 26.0–34.0)
MCHC: 32.8 g/dL (ref 30.0–36.0)
MCV: 83.4 fL (ref 80.0–100.0)
Platelets: 340 10*3/uL (ref 150–400)
RBC: 4.64 MIL/uL (ref 3.87–5.11)
RDW: 13.3 % (ref 11.5–15.5)
WBC: 10 10*3/uL (ref 4.0–10.5)
nRBC: 0 % (ref 0.0–0.2)

## 2022-10-28 LAB — LIPASE, BLOOD: Lipase: 10 U/L — ABNORMAL LOW (ref 11–51)

## 2022-10-28 LAB — RESP PANEL BY RT-PCR (RSV, FLU A&B, COVID)  RVPGX2
Influenza A by PCR: NEGATIVE
Influenza B by PCR: NEGATIVE
Resp Syncytial Virus by PCR: NEGATIVE
SARS Coronavirus 2 by RT PCR: NEGATIVE

## 2022-10-28 LAB — HCG, SERUM, QUALITATIVE: Preg, Serum: NEGATIVE

## 2022-10-28 MED ORDER — PROCHLORPERAZINE EDISYLATE 10 MG/2ML IJ SOLN
10.0000 mg | Freq: Once | INTRAMUSCULAR | Status: AC
  Administered 2022-10-28: 10 mg via INTRAVENOUS
  Filled 2022-10-28: qty 2

## 2022-10-28 MED ORDER — ONDANSETRON HCL 4 MG/2ML IJ SOLN
4.0000 mg | Freq: Once | INTRAMUSCULAR | Status: DC | PRN
  Filled 2022-10-28: qty 2

## 2022-10-28 NOTE — Discharge Instructions (Signed)
Please call the phone number provided for you to be seen by a primary care provider in the next few days.  In the meantime have a bland diet with small meals and take in fluids as tolerated.  If symptoms worsen please return to ER.
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
## 2022-10-28 NOTE — ED Notes (Signed)
Pt verbalized understanding of d/c instructions, meds, and followup care. Denies questions. VSS, no distress noted. Steady gait to exit with all belongings. 

## 2022-10-28 NOTE — ED Triage Notes (Signed)
Sudden severe headache this am, n/v. Feels very weak and light headed.

## 2022-10-31 ENCOUNTER — Other Ambulatory Visit: Payer: Self-pay

## 2022-10-31 ENCOUNTER — Emergency Department (HOSPITAL_BASED_OUTPATIENT_CLINIC_OR_DEPARTMENT_OTHER): Payer: Medicaid Other

## 2022-10-31 ENCOUNTER — Emergency Department (HOSPITAL_BASED_OUTPATIENT_CLINIC_OR_DEPARTMENT_OTHER)
Admission: EM | Admit: 2022-10-31 | Discharge: 2022-11-01 | Disposition: A | Discharge status: Home / Self Care | Payer: Medicaid Other | Attending: Emergency Medicine | Admitting: Emergency Medicine

## 2022-10-31 DIAGNOSIS — S60415A Abrasion of left ring finger, initial encounter: Secondary | ICD-10-CM | POA: Insufficient documentation

## 2022-10-31 DIAGNOSIS — Y92009 Unspecified place in unspecified non-institutional (private) residence as the place of occurrence of the external cause: Secondary | ICD-10-CM | POA: Diagnosis not present

## 2022-10-31 DIAGNOSIS — S80211A Abrasion, right knee, initial encounter: Secondary | ICD-10-CM | POA: Insufficient documentation

## 2022-10-31 DIAGNOSIS — S0990XA Unspecified injury of head, initial encounter: Secondary | ICD-10-CM

## 2022-10-31 DIAGNOSIS — S199XXA Unspecified injury of neck, initial encounter: Secondary | ICD-10-CM | POA: Diagnosis not present

## 2022-10-31 DIAGNOSIS — Z043 Encounter for examination and observation following other accident: Secondary | ICD-10-CM | POA: Diagnosis not present

## 2022-10-31 DIAGNOSIS — W19XXXA Unspecified fall, initial encounter: Secondary | ICD-10-CM

## 2022-10-31 DIAGNOSIS — S0003XA Contusion of scalp, initial encounter: Secondary | ICD-10-CM | POA: Diagnosis not present

## 2022-10-31 DIAGNOSIS — W109XXA Fall (on) (from) unspecified stairs and steps, initial encounter: Secondary | ICD-10-CM | POA: Insufficient documentation

## 2022-10-31 DIAGNOSIS — Y9389 Activity, other specified: Secondary | ICD-10-CM | POA: Diagnosis not present

## 2022-10-31 DIAGNOSIS — M7989 Other specified soft tissue disorders: Secondary | ICD-10-CM | POA: Diagnosis not present

## 2022-10-31 DIAGNOSIS — S80212A Abrasion, left knee, initial encounter: Secondary | ICD-10-CM | POA: Diagnosis not present
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
## 2022-11-01 ENCOUNTER — Emergency Department (HOSPITAL_BASED_OUTPATIENT_CLINIC_OR_DEPARTMENT_OTHER): Payer: Medicaid Other | Admitting: Radiology

## 2022-11-01 DIAGNOSIS — S0003XA Contusion of scalp, initial encounter: Secondary | ICD-10-CM | POA: Diagnosis not present

## 2022-11-01 DIAGNOSIS — Z043 Encounter for examination and observation following other accident: Secondary | ICD-10-CM | POA: Diagnosis not present

## 2022-11-01 DIAGNOSIS — S199XXA Unspecified injury of neck, initial encounter: Secondary | ICD-10-CM | POA: Diagnosis not present

## 2022-11-01 MED ORDER — ACETAMINOPHEN 325 MG PO TABS
650.0000 mg | ORAL_TABLET | Freq: Once | ORAL | Status: AC
Start: 2022-11-01 — End: 2022-11-01
  Administered 2022-11-01: 650 mg via ORAL
  Filled 2022-11-01: qty 2
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
## 2022-11-01 NOTE — ED Notes (Signed)
Patient able to ambulate  in room and hall without assistance. Steady gait, denies dizziness/lightheadedness.
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
## 2022-11-01 NOTE — ED Provider Notes (Signed)
[facility] Provider Note   CSN: [id number] Arrival date & time: 10/31/22  2300     History  Chief Complaint  Patient presents with   Fall    Monique Keith is a 27 y.o. female.  Patient sustained head injury after falling down 2-3 steps at home on the concrete.  She was chasing after her dog.  States she missed about 2 or 3 steps and fell forward striking her right forehead, left hand and bilateral knees.  Denies losing consciousness.  Nausea but no vomiting.  No visual change.  No focal weakness, numbness or tingling.  No midline neck or back pain.  No chest pain or abdominal pain.  No blood thinner use.  No headache preceding the fall.  Hematoma to right forehead and an abrasion to left hand and bilateral knees.  Tetanus up-to-date.  The history is provided by the patient.  Fall Associated symptoms include headaches. Pertinent negatives include no chest pain, no abdominal pain and no shortness of breath.       Home Medications Prior to Admission medications   Medication Sig Start Date End Date Taking? Authorizing Provider  benzonatate (TESSALON) 100 MG capsule Take 1 capsule (100 mg total) by mouth every 8 (eight) hours. Patient not taking: Reported on 11/28/2021 07/23/21   Sherrill Raring, PA-C  celecoxib (CELEBREX) 200 MG capsule Take 1 capsule (200 mg total) by mouth 2 (two) times daily. Patient not taking: Reported on 11/28/2021 08/08/21   Margarita Mail, PA-C  guaiFENesin (MUCINEX) 600 MG 12 hr tablet Take 1 tablet (600 mg total) by mouth 2 (two) times daily. Patient not taking: Reported on 11/28/2021 09/17/21   Larene Pickett, PA-C  mometasone (NASONEX) 50 MCG/ACT nasal spray Place 2 sprays into the nose daily. Patient not taking: Reported on 11/28/2021 09/17/21   Larene Pickett, PA-C      Allergies    Patient has no known allergies.    Review of Systems   Review of Systems  Constitutional:  Negative for activity change,  appetite change and fever.  HENT:  Negative for congestion.   Respiratory:  Negative for cough, chest tightness and shortness of breath.   Cardiovascular:  Negative for chest pain.  Gastrointestinal:  Negative for abdominal pain, nausea and vomiting.  Genitourinary:  Negative for dysuria.  Musculoskeletal:  Positive for arthralgias and myalgias. Negative for neck pain.  Skin:  Positive for wound.  Neurological:  Positive for headaches. Negative for dizziness, weakness and light-headedness.   all other systems are negative except as noted in the HPI and PMH.    Physical Exam Updated Vital Signs BP 130/89 (BP Location: Left Leg)   Pulse 93   Temp 98.5 F (36.9 C)   Resp 18   Ht 5' 4"$  (1.626 m)   Wt 68 kg   LMP 10/27/2022   SpO2 100%   BMI 25.75 kg/m  Physical Exam Vitals and nursing note reviewed.  Constitutional:      General: She is not in acute distress.    Appearance: She is well-developed.  HENT:     Head: Normocephalic.     Comments: Hematoma and abrasion to right frontal scalp.  No malocclusion or trismus. No septal hematoma or hemotympanum    Mouth/Throat:     Pharynx: No oropharyngeal exudate.  Eyes:     Extraocular Movements: Extraocular movements intact.     Conjunctiva/sclera: Conjunctivae normal.     Pupils: Pupils are equal, round, and reactive  to light.     Comments: No pain with EOM  Neck:     Comments: No midline C-spine tenderness Cardiovascular:     Rate and Rhythm: Normal rate and regular rhythm.     Heart sounds: Normal heart sounds. No murmur heard. Pulmonary:     Effort: Pulmonary effort is normal. No respiratory distress.     Breath sounds: Normal breath sounds.  Abdominal:     Palpations: Abdomen is soft.     Tenderness: There is no abdominal tenderness. There is no guarding or rebound.  Musculoskeletal:        General: Tenderness present. Normal range of motion.     Cervical back: Normal range of motion and neck supple.     Comments:  Abrasion left fourth dorsal MCP.  Tenderness along proximal phalanx of fourth finger.  Reduced range of motion secondary to pain.  Abrasions bilateral knees without bony tenderness.  Skin:    General: Skin is warm.  Neurological:     Mental Status: She is alert and oriented to person, place, and time.     Cranial Nerves: No cranial nerve deficit.     Motor: No abnormal muscle tone.     Coordination: Coordination normal.     Comments:  5/5 strength throughout. CN 2-12 intact.Equal grip strength.   Psychiatric:        Behavior: Behavior normal.     ED Results / Procedures / Treatments   Labs (all labs ordered are listed, but only abnormal results are displayed) Labs Reviewed - No data to display  EKG None  Radiology DG Knee Complete 4 Views Right  Result Date: 11/01/2022 CLINICAL DATA:  Fall. EXAM: RIGHT KNEE - COMPLETE 4+ VIEW COMPARISON:  None Available. FINDINGS: No evidence of fracture, dislocation, or joint effusion. No evidence of arthropathy or other focal bone abnormality. Soft tissues are unremarkable. IMPRESSION: Negative. Electronically Signed   By: Brett Fairy M.D.   On: 11/01/2022 00:47   CT Head Wo Contrast  Result Date: 10/31/2022 CLINICAL DATA:  Fall, head and neck trauma. EXAM: CT HEAD WITHOUT CONTRAST CT CERVICAL SPINE WITHOUT CONTRAST TECHNIQUE: Multidetector CT imaging of the head and cervical spine was performed following the standard protocol without intravenous contrast. Multiplanar CT image reconstructions of the cervical spine were also generated. RADIATION DOSE REDUCTION: This exam was performed according to the departmental dose-optimization program which includes automated exposure control, adjustment of the mA and/or kV according to patient size and/or use of iterative reconstruction technique. COMPARISON:  03/30/2018, 07/03/2015. FINDINGS: CT HEAD FINDINGS Brain: No acute intracranial hemorrhage, midline shift or mass effect. No extra-axial fluid  collection. Gray-white matter differentiation is within normal limits. No hydrocephalus. Vascular: No hyperdense vessel or unexpected calcification. Skull: Normal. Negative for fracture or focal lesion. Sinuses/Orbits: Mucosal thickening is present in the maxillary sinuses and ethmoid air cells. No acute orbital abnormality. Other: Scalp hematoma is present over the frontal bone on the right. CT CERVICAL SPINE FINDINGS Alignment: Normal. Skull base and vertebrae: No acute fracture. No primary bone lesion or focal pathologic process. Soft tissues and spinal canal: No prevertebral fluid or swelling. No visible canal hematoma. Disc levels: Mild degenerative endplate changes are present at C5-C6. Upper chest: No acute abnormality. Other: None. IMPRESSION: 1. No acute intracranial process. 2. Scalp hematoma over the frontal bone on the right. 3. No acute fracture or subluxation in the cervical spine. Electronically Signed   By: Brett Fairy M.D.   On: 10/31/2022 23:37   CT  Cervical Spine Wo Contrast  Result Date: 10/31/2022 CLINICAL DATA:  Fall, head and neck trauma. EXAM: CT HEAD WITHOUT CONTRAST CT CERVICAL SPINE WITHOUT CONTRAST TECHNIQUE: Multidetector CT imaging of the head and cervical spine was performed following the standard protocol without intravenous contrast. Multiplanar CT image reconstructions of the cervical spine were also generated. RADIATION DOSE REDUCTION: This exam was performed according to the departmental dose-optimization program which includes automated exposure control, adjustment of the mA and/or kV according to patient size and/or use of iterative reconstruction technique. COMPARISON:  03/30/2018, 07/03/2015. FINDINGS: CT HEAD FINDINGS Brain: No acute intracranial hemorrhage, midline shift or mass effect. No extra-axial fluid collection. Gray-white matter differentiation is within normal limits. No hydrocephalus. Vascular: No hyperdense vessel or unexpected calcification. Skull: Normal.  Negative for fracture or focal lesion. Sinuses/Orbits: Mucosal thickening is present in the maxillary sinuses and ethmoid air cells. No acute orbital abnormality. Other: Scalp hematoma is present over the frontal bone on the right. CT CERVICAL SPINE FINDINGS Alignment: Normal. Skull base and vertebrae: No acute fracture. No primary bone lesion or focal pathologic process. Soft tissues and spinal canal: No prevertebral fluid or swelling. No visible canal hematoma. Disc levels: Mild degenerative endplate changes are present at C5-C6. Upper chest: No acute abnormality. Other: None. IMPRESSION: 1. No acute intracranial process. 2. Scalp hematoma over the frontal bone on the right. 3. No acute fracture or subluxation in the cervical spine. Electronically Signed   By: Brett Fairy M.D.   On: 10/31/2022 23:37   DG Hand Complete Left  Result Date: 10/31/2022 CLINICAL DATA:  Fall, abrasion. EXAM: LEFT HAND - COMPLETE 3+ VIEW COMPARISON:  None Available. FINDINGS: There is no evidence of fracture or dislocation. There is no evidence of arthropathy or other focal bone abnormality. Soft tissues swelling and edema about the first web space. IMPRESSION: 1. No fracture or dislocation. 2. Soft tissue swelling and edema about the first interdigital space. Electronically Signed   By: Keane Police D.O.   On: 10/31/2022 23:32    Procedures Procedures    Medications Ordered in ED Medications  acetaminophen (TYLENOL) tablet 650 mg (has no administration in time range)    ED Course/ Medical Decision Making/ A&P                             Medical Decision Making Amount and/or Complexity of Data Reviewed Labs: ordered. Decision-making details documented in ED Course. Radiology: ordered and independent interpretation performed. Decision-making details documented in ED Course. ECG/medicine tests: ordered and independent interpretation performed. Decision-making details documented in ED Course.  Risk OTC  drugs.   Head Injury and scalp hematoma.  No loss of consciousness.  No blood thinner use.  GCS 15, ABCs intact.  CT obtained in triage is negative for acute intracranial hemorrhage.  No skull fracture.  Results reviewed interpreted by me.  No C-spine fracture.  X-ray of left hand and right knee are negative for fracture as well.  Suspect closed head injury with likely concussion.  Discussed with patient headache, dizziness and nausea may persist for several days to weeks.  Head injury instructions given.  She is tolerating p.o. and ambulatory.  Her tetanus shot is up-to-date.  Discussed supportive care at home and PCP follow-up.  Return precautions given.       Final Clinical Impression(s) / ED Diagnoses Final diagnoses:  None    Rx / DC Orders ED Discharge Orders     None  Ezequiel Essex, MD 11/01/22 [phone]

## 2022-11-01 NOTE — ED Notes (Signed)
Patient tolerated ginger ale

## 2022-11-01 NOTE — ED Notes (Signed)
Reviewed AVS/discharge instruction with patient. Time allotted for and all questions answered. Patient is agreeable for d/c and escorted to ed exit by staff.  

## 2022-12-07 ENCOUNTER — Encounter (HOSPITAL_COMMUNITY): Payer: Self-pay | Admitting: *Deleted

## 2022-12-07 ENCOUNTER — Inpatient Hospital Stay (HOSPITAL_COMMUNITY)
Admission: AD | Admit: 2022-12-07 | Discharge: 2022-12-07 | Disposition: A | Discharge status: Home / Self Care | Payer: Medicaid Other | Attending: Obstetrics & Gynecology | Admitting: Obstetrics & Gynecology

## 2022-12-07 ENCOUNTER — Telehealth (HOSPITAL_COMMUNITY): Payer: Self-pay | Admitting: *Deleted

## 2022-12-07 DIAGNOSIS — Z3A01 Less than 8 weeks gestation of pregnancy: Secondary | ICD-10-CM

## 2022-12-07 DIAGNOSIS — O26899 Other specified pregnancy related conditions, unspecified trimester: Secondary | ICD-10-CM

## 2022-12-07 DIAGNOSIS — O26891 Other specified pregnancy related conditions, first trimester: Secondary | ICD-10-CM | POA: Insufficient documentation

## 2022-12-07 DIAGNOSIS — R110 Nausea: Secondary | ICD-10-CM | POA: Diagnosis not present

## 2022-12-07 DIAGNOSIS — Z3687 Encounter for antenatal screening for uncertain dates: Secondary | ICD-10-CM

## 2022-12-07 HISTORY — DX: Headache, unspecified: R51.9

## 2022-12-07 HISTORY — DX: Unspecified ovarian cyst, unspecified side: N83.209

## 2022-12-07 LAB — URINALYSIS, ROUTINE W REFLEX MICROSCOPIC
Bilirubin Urine: NEGATIVE
Glucose, UA: NEGATIVE mg/dL
Hgb urine dipstick: NEGATIVE
Ketones, ur: 5 mg/dL — AB
Leukocytes,Ua: NEGATIVE
Nitrite: NEGATIVE
Protein, ur: 30 mg/dL — AB
Specific Gravity, Urine: 1.03 (ref 1.005–1.030)
pH: 5 (ref 5.0–8.0)

## 2022-12-07 LAB — POCT PREGNANCY, URINE: Preg Test, Ur: POSITIVE — AB

## 2022-12-07 MED ORDER — ONDANSETRON 4 MG PO TBDP: 4.0000 mg | ORAL_TABLET | Freq: Three times a day (TID) | ORAL | 3 refills | Status: DC | PRN

## 2022-12-07 NOTE — MAU Provider Note (Signed)
Event Date/Time   First Provider Initiated Contact with Patient 12/07/22 1352     S [name] is a 27 y.o. G2P1001 pregnant female at [redacted]w[redacted]d who presents to MAU today with complaint of nausea all week after two positive UPT, desires something for nausea. Has not thrown up yet, only very mild nausea here. Has used zofran in the past and would like a prescription again.   Has not established routine OB care yet.  Pertinent items noted in HPI and remainder of comprehensive ROS otherwise negative.   O BP 126/84 (BP Location: Right Arm)   Pulse 72   Temp 98.3 F (36.8 C) (Oral)   Resp 16   Ht 5\' 4"  (1.626 m)   Wt 150 lb (68 kg)   LMP 10/28/2022   SpO2 100%   BMI 25.75 kg/m  Physical Exam Vitals and nursing note reviewed.  Constitutional:      General: She is not in acute distress.    Appearance: She is well-developed. She is not ill-appearing.  HENT:     Head: Normocephalic.  Cardiovascular:     Rate and Rhythm: Normal rate.  Pulmonary:     Effort: Pulmonary effort is normal.  Musculoskeletal:        General: Normal range of motion.  Skin:    General: Skin is warm and dry.     Capillary Refill: Capillary refill takes less than 2 seconds.  Neurological:     Mental Status: She is alert and oriented to person, place, and time.  Psychiatric:        Mood and Affect: Mood normal.        Behavior: Behavior normal.     MDM: Straightforward MAU Course: Exam completed in family room due to MAU acuity and pt stability. Pt offered zofran here but declined, will send prescription to her pharmacy.   A Less than [redacted] weeks gestation of pregnancy - Plan: US OB Transvaginal, Discharge patient  Pregnancy related nausea, antepartum  Unsure of last menstrual period as reason for ultrasound scan - Plan: US OB Transvaginal  Medical screening exam complete  P Discharge from MAU in stable condition with first trimester precautions Follow up at [facility] for viability/dating  ultrasound Sent message to [facility] for scheduling assistance  Allergies as of 12/07/2022   No Known Allergies      Medication List     STOP taking these medications    benzonatate 100 MG capsule Commonly known as: TESSALON   celecoxib 200 MG capsule Commonly known as: CeleBREX   guaiFENesin 600 MG 12 hr tablet Commonly known as: Mucinex   mometasone 50 MCG/ACT nasal spray Commonly known as: Nasonex       TAKE these medications    ondansetron 4 MG disintegrating tablet Commonly known as: ZOFRAN-ODT Take 1 tablet (4 mg total) by mouth every 8 (eight) hours as needed for nausea or vomiting.        Gabriel Carina, [place] 12/07/2022 1:59 PM

## 2022-12-07 NOTE — MAU Note (Signed)
Monique Keith is a 27 y.o. at Unknown here in MAU reporting: been feeling like nauseous all wk, took 2 preg tests and they both came back positive, missed her period this month.  Has not thrown up. Little cramping no bleeding. LMP: 2/10 Onset of complaint: all wk Pain score: 1 Vitals:   12/07/22 1313  BP: (!) 131/93  Pulse: (!) 55  Resp: 16  Temp: 98.3 F (36.8 C)  SpO2: 100%      Lab orders placed from triage:  +UPT/UA was sent

## 2022-12-19 ENCOUNTER — Ambulatory Visit (INDEPENDENT_AMBULATORY_CARE_PROVIDER_SITE_OTHER): Payer: Medicaid Other

## 2022-12-19 ENCOUNTER — Other Ambulatory Visit: Payer: Self-pay | Admitting: Obstetrics and Gynecology

## 2022-12-19 ENCOUNTER — Ambulatory Visit: Payer: Medicaid Other | Admitting: Advanced Practice Midwife

## 2022-12-19 DIAGNOSIS — O99611 Diseases of the digestive system complicating pregnancy, first trimester: Secondary | ICD-10-CM | POA: Diagnosis not present

## 2022-12-19 DIAGNOSIS — Z3481 Encounter for supervision of other normal pregnancy, first trimester: Secondary | ICD-10-CM | POA: Diagnosis not present

## 2022-12-19 DIAGNOSIS — O210 Mild hyperemesis gravidarum: Secondary | ICD-10-CM

## 2022-12-19 DIAGNOSIS — Z3A01 Less than 8 weeks gestation of pregnancy: Secondary | ICD-10-CM

## 2022-12-19 DIAGNOSIS — O26899 Other specified pregnancy related conditions, unspecified trimester: Secondary | ICD-10-CM

## 2022-12-19 DIAGNOSIS — K5900 Constipation, unspecified: Secondary | ICD-10-CM

## 2022-12-19 DIAGNOSIS — Z3687 Encounter for antenatal screening for uncertain dates: Secondary | ICD-10-CM

## 2022-12-19 DIAGNOSIS — Z3491 Encounter for supervision of normal pregnancy, unspecified, first trimester: Secondary | ICD-10-CM

## 2022-12-19 MED ORDER — FLEET ENEMA 7-19 GM/118ML RE ENEM: 1.0000 | ENEMA | Freq: Every day | RECTAL | 3 refills | Status: DC | PRN

## 2022-12-19 MED ORDER — PROMETHAZINE HCL 25 MG PO TABS: 25.0000 mg | ORAL_TABLET | Freq: Four times a day (QID) | ORAL | 6 refills | Status: DC | PRN

## 2022-12-19 MED ORDER — POLYETHYLENE GLYCOL 3350 17 GM/SCOOP PO POWD: 17.0000 g | Freq: Two times a day (BID) | ORAL | 3 refills | Status: DC | PRN

## 2022-12-19 NOTE — Patient Instructions (Signed)
[facility] Prov[facility]   [facility]             [address], [place], [place] [address] [phone]  [facility]                                                             [address], [address], [place], [place], [address] [phone]  [facility]                                    [address], [address], [place], [place], [address] [phone]  [facility] [place] [address], [address], [place], [place], [address] [phone]  [facility]                                 [address], [place], [place], [address] [phone]  [facility]                                    [address], [place], [place], [address] [phone]  [facility] [address], [address], [place], [place], [address]                               [facility] [address], [address], [place], [place], [address] [phone]  [facility]         Phone: [phone]  [facility]      Phone: [phone]   [facility]      Phone: [phone]  [facility]         Phone: [phone]   [facility]    Phone: [phone]  [facility]      Phone: [phone]  [facility]     Phone: [phone]  [facility]        Phone: [phone]  [facility] (Sheronette Cousins) [phone]  [facility]      Phone: [phone]   

## 2022-12-19 NOTE — Progress Notes (Signed)
Ultrasounds Results Note  SUBJECTIVE HPI:  [name] is a 27 y.o. G2P1001 at [redacted]w[redacted]d by LMP who presents to [facility] for followup ultrasound results. The patient reports generalized abdominal cramping 2/2 severe constipation.Reports no BM in 7-10 days. Hasn't tried anything for it. Also reports N/V. Vomits about 4 x per day even with Zofran. Hasn't tried other meds.  Denies vaginal bleeding.   No previous US's this pregnancy.     Repeat ultrasound was performed earlier today.   Past Medical History:  Diagnosis Date   Anemia    Chlamydia    Gonorrhea    Headache    Hypertension    Hypoglycemia    Ovarian cyst    Past Surgical History:  Procedure Laterality Date   CESAREAN SECTION N/A 06/03/2019   Procedure: CESAREAN SECTION;  Surgeon: Christophe Louis, MD;  Location: [facility] LD ORS;  Service: Obstetrics;  Laterality: N/A;   HERNIA REPAIR     UMBILICAL HERNIA REPAIR     Social History   Socioeconomic History   Marital status: Single    Spouse name: Not on file   Number of children: Not on file   Years of education: Not on file   Highest education level: Not on file  Occupational History   Not on file  Tobacco Use   Smoking status: Former    Packs/day: .25    Types: Cigarettes    Quit date: 10/08/2018    Years since quitting: 4.2   Smokeless tobacco: Never  Vaping Use   Vaping Use: Never used  Substance and Sexual Activity   Alcohol use: No    Alcohol/week: 0.0 standard drinks of alcohol   Drug use: Not Currently    Types: Marijuana    Comment: not since first preg   Sexual activity: Yes    Birth control/protection: None  Other Topics Concern   Not on file  Social History Narrative   Not on file   Social Determinants of Health   Financial Resource Strain: Not on file  Food Insecurity: No Food Insecurity (12/29/2021)   Hunger Vital Sign    Worried About Running Out of Food in the Last Year: Never true    Ran Out of Food in the Last Year: Never true   Transportation Needs: No Transportation Needs (12/29/2021)   PRAPARE - Hydrologist (Medical): No    Lack of Transportation (Non-Medical): No  Physical Activity: Not on file  Stress: Not on file  Social Connections: Not on file  Intimate Partner Violence: Not on file   Current Outpatient Medications on File Prior to Visit  Medication Sig Dispense Refill   ondansetron (ZOFRAN-ODT) 4 MG disintegrating tablet Take 1 tablet (4 mg total) by mouth every 8 (eight) hours as needed for nausea or vomiting. 30 tablet 3   No current facility-administered medications on file prior to visit.   No Known Allergies  I have reviewed patient's Past Medical Hx, Surgical Hx, Family Hx, Social Hx, medications and allergies.   Review of Systems Review of Systems  Constitutional: Negative for fever and chills.  Gastrointestinal: Positive for abdominal pain. Pos for constipation. N/V.  Genitourinary: Negative for vaginal bleeding.  Musculoskeletal: Negative for back pain.  Neurological: Negative for dizziness and weakness.    Physical Exam  LMP 10/28/2022   Patient's last menstrual period was 10/28/2022. GENERAL: Well-developed, well-nourished female in mild distress that she says is from nausea.  HEENT: Normocephalic, atraumatic.  LUNGS: Effort normal ABDOMEN: Deferred HEART: Regular rate  SKIN: Warm, dry and without erythema PSYCH: Normal mood and affect NEURO: Alert and oriented x 4  LAB RESULTS No results found for this or any previous visit (from the past 24 hour(s)).  IMAGING 7.6 week live SIUP. FHR 129.   ASSESSMENT 1. Hyperemesis affecting pregnancy, antepartum - D/C Zofran - promethazine (PHENERGAN) 25 MG tablet; Take 1 tablet (25 mg total) by mouth every 6 (six) hours as needed for nausea or vomiting.  Dispense: 30 tablet; Refill: 6  2. Constipation during pregnancy in first trimester  - polyethylene glycol powder (GLYCOLAX/MIRALAX) 17 GM/SCOOP  powder; Take 17 g by mouth 2 (two) times daily as needed for moderate constipation.  Dispense: 500 g; Refill: 3 - sodium phosphate (FLEET) 7-19 GM/118ML ENEM; Place 133 mLs (1 enema total) rectally daily as needed for severe constipation.  Dispense: 133 mL; Refill: 3  3. [redacted] weeks gestation of pregnancy   4. Normal IUP (intrauterine pregnancy) on prenatal ultrasound, first trimester  PLAN Start prenatal care with provider of your choice. Plans [facility].  Offered MAU for enema vs enema at home. Prefers home. List of Ob/Gyn providers given. Patient advised to continue taking prenatal vitamins Go to MAU as needed for heavy bleeding, abdominal pain or fever greater than 100.4.  [place], CNM 12/19/2022 11:18 AM

## 2022-12-25 DIAGNOSIS — O210 Mild hyperemesis gravidarum: Secondary | ICD-10-CM | POA: Insufficient documentation

## 2022-12-26 ENCOUNTER — Inpatient Hospital Stay (HOSPITAL_COMMUNITY)
Admission: AD | Admit: 2022-12-26 | Discharge: 2022-12-27 | Disposition: A | Discharge status: Home / Self Care | Payer: Medicaid Other | Attending: Obstetrics and Gynecology | Admitting: Obstetrics and Gynecology

## 2022-12-26 ENCOUNTER — Encounter (HOSPITAL_COMMUNITY): Payer: Self-pay | Admitting: Obstetrics and Gynecology

## 2022-12-26 DIAGNOSIS — O219 Vomiting of pregnancy, unspecified: Secondary | ICD-10-CM | POA: Diagnosis not present

## 2022-12-26 DIAGNOSIS — O210 Mild hyperemesis gravidarum: Secondary | ICD-10-CM | POA: Diagnosis present

## 2022-12-26 DIAGNOSIS — R109 Unspecified abdominal pain: Secondary | ICD-10-CM | POA: Insufficient documentation

## 2022-12-26 DIAGNOSIS — Z87891 Personal history of nicotine dependence: Secondary | ICD-10-CM | POA: Insufficient documentation

## 2022-12-26 DIAGNOSIS — Z3A08 8 weeks gestation of pregnancy: Secondary | ICD-10-CM | POA: Diagnosis not present

## 2022-12-26 DIAGNOSIS — O211 Hyperemesis gravidarum with metabolic disturbance: Secondary | ICD-10-CM | POA: Diagnosis not present

## 2022-12-26 DIAGNOSIS — E860 Dehydration: Secondary | ICD-10-CM

## 2022-12-26 LAB — URINALYSIS, ROUTINE W REFLEX MICROSCOPIC
Glucose, UA: NEGATIVE mg/dL
Hgb urine dipstick: NEGATIVE
Ketones, ur: 80 mg/dL — AB
Leukocytes,Ua: NEGATIVE
Nitrite: NEGATIVE
Protein, ur: 100 mg/dL — AB
Specific Gravity, Urine: 1.032 — ABNORMAL HIGH (ref 1.005–1.030)
pH: 5 (ref 5.0–8.0)

## 2022-12-26 MED ORDER — ONDANSETRON HCL 4 MG/2ML IJ SOLN
4.0000 mg | Freq: Once | INTRAMUSCULAR | Status: AC
  Administered 2022-12-26: 4 mg via INTRAVENOUS
  Filled 2022-12-26: qty 2

## 2022-12-26 MED ORDER — LACTATED RINGERS IV BOLUS
1000.0000 mL | Freq: Once | INTRAVENOUS | Status: AC
  Administered 2022-12-26: 1000 mL via INTRAVENOUS

## 2022-12-26 MED ORDER — FAMOTIDINE IN NACL 20-0.9 MG/50ML-% IV SOLN
20.0000 mg | Freq: Once | INTRAVENOUS | Status: AC
  Administered 2022-12-26: 20 mg via INTRAVENOUS
  Filled 2022-12-26: qty 50

## 2022-12-26 MED ORDER — SCOPOLAMINE 1 MG/3DAYS TD PT72
1.0000 | MEDICATED_PATCH | Freq: Once | TRANSDERMAL | Status: DC
  Administered 2022-12-26: 1.5 mg via TRANSDERMAL
  Filled 2022-12-26: qty 1

## 2022-12-26 NOTE — MAU Note (Signed)
.  Monique Keith is a 27 y.o. at [redacted]w[redacted]d here in MAU reporting unable to keep down anything for 2wks. Zofran not helping. Can't take phenergan because I have a 27yo. Denies VB.  Mild lower abd cramping Onset of complaint: 2wks Pain score: 3 Vitals:   12/26/22 2009 12/26/22 2013  BP:  117/82  Pulse: 85   Resp: 17   Temp: 99 F (37.2 C)   SpO2: 99%      FHT:n/a Lab orders placed from triage:  u/a

## 2022-12-26 NOTE — MAU Provider Note (Signed)
History     CSN: [id number]  Arrival date and time: 12/26/22 1935   Event Date/Time   First Provider Initiated Contact with Patient 12/26/22 2232      Chief Complaint  Patient presents with   Abdominal Pain   Monique ProctorMiyanna Boehne , a  27 y.o. G2P1001 at 324w3d presents to MAU with complaints of on-going nausea and vomiting. Patient states she has been throwing up the past 2 weeks and states in the last 2 days it has been "really bad." She states she was prescribed zofran, last dose yesterday without relief. She endorses >6 episodes of vomiting today. Patient reports loss of appetite and last meal was 2 days ago. She also endorses abdominal pain, she states that "it just feels like its empty and it hurts." She denies attempting to relieve pain. She states "its hard to keep anything down." She reports a 26 year old at home and promethazine makes her drowsy so she does not take it. She denies vaginal bleeding, leaking of fluid and contractions.          OB History     Gravida  2   Para  1   Term  1   Preterm      AB      Living  1      SAB      IAB      Ectopic      Multiple      Live Births  1           Past Medical History:  Diagnosis Date   Anemia    Chlamydia    Gonorrhea    Headache    Hypertension    Hypoglycemia    Ovarian cyst     Past Surgical History:  Procedure Laterality Date   CESAREAN SECTION N/A 06/03/2019   Procedure: CESAREAN SECTION;  Surgeon: Gerald Leitzole, Tara, MD;  Location: [facility];  Service: Obstetrics;  Laterality: N/A;   HERNIA REPAIR     UMBILICAL HERNIA REPAIR      Family History  Problem Relation Age of Onset   Asthma Mother    Heart disease Father    Hypertension Maternal Grandmother    Hypertension Maternal Grandfather    Hyperlipidemia Maternal Grandfather    Diabetes Maternal Grandfather     Social History   Tobacco Use   Smoking status: Former    Packs/day: .25    Types: Cigarettes    Quit date: 10/08/2018    Years  since quitting: 4.2   Smokeless tobacco: Never  Vaping Use   Vaping Use: Never used  Substance Use Topics   Alcohol use: No    Alcohol/week: 0.0 standard drinks of alcohol   Drug use: Not Currently    Types: Marijuana    Comment: not since first preg    Allergies: No Known Allergies  Medications Prior to Admission  Medication Sig Dispense Refill Last Dose   ondansetron (ZOFRAN-ODT) 4 MG disintegrating tablet Take 1 tablet (4 mg total) by mouth every 8 (eight) hours as needed for nausea or vomiting. 30 tablet 3 12/25/2022   polyethylene glycol powder (GLYCOLAX/MIRALAX) 17 GM/SCOOP powder Take 17 g by mouth 2 (two) times daily as needed for moderate constipation. 500 g 3 Past Week   sodium phosphate (FLEET) 7-19 GM/118ML ENEM Place 133 mLs (1 enema total) rectally daily as needed for severe constipation. 133 mL 3 Past Week   promethazine (PHENERGAN) 25 MG tablet Take 1 tablet (25 mg  total) by mouth every 6 (six) hours as needed for nausea or vomiting. (Patient not taking: Reported on 12/26/2022) 30 tablet 6 Not Taking    Review of Systems  Constitutional:  Positive for fatigue. Negative for chills and fever.  Eyes:  Negative for pain and visual disturbance.  Respiratory:  Negative for apnea, shortness of breath and wheezing.   Cardiovascular:  Negative for chest pain and palpitations.  Gastrointestinal:  Positive for abdominal pain, nausea and vomiting. Negative for constipation and diarrhea.  Genitourinary:  Negative for difficulty urinating, dysuria, pelvic pain, vaginal bleeding, vaginal discharge and vaginal pain.  Musculoskeletal:  Negative for back pain.  Neurological:  Negative for seizures, weakness and headaches.  Psychiatric/Behavioral:  Negative for suicidal ideas.    Physical Exam   Blood pressure 117/82, pulse 85, temperature 99 F (37.2 C), resp. rate 17, height 5\' 4"  (1.626 m), weight 64.9 kg, last menstrual period 10/28/2022, SpO2 99 %.  Physical Exam Vitals and  nursing note reviewed. Chaperone present: Patient actively vomiting during exam..  Constitutional:      General: She is not in acute distress.    Appearance: Normal appearance. She is ill-appearing.  HENT:     Head: Normocephalic.  Pulmonary:     Effort: Pulmonary effort is normal.  Abdominal:     General: Abdomen is flat.     Palpations: Abdomen is soft.     Tenderness: There is no abdominal tenderness. There is no guarding.  Musculoskeletal:     Cervical back: Normal range of motion.  Skin:    General: Skin is warm and dry.     Capillary Refill: Capillary refill takes 2 to 3 seconds.  Neurological:     Mental Status: She is alert and oriented to person, place, and time.  Psychiatric:        Mood and Affect: Mood normal.     MAU Course  Procedures Orders Placed This Encounter  Procedures   Culture, OB Urine   Urinalysis, Routine w reflex microscopic -Urine, Clean Catch   Insert peripheral IV   Meds ordered this encounter  Medications   lactated ringers bolus 1,000 mL   scopolamine (TRANSDERM-SCOP) 1 MG/3DAYS 1.5 mg   ondansetron (ZOFRAN) injection 4 mg   famotidine (PEPCID) IVPB 20 mg premix   Results for orders placed or performed during the hospital encounter of 12/26/22 (from the past 24 hour(s))  Urinalysis, Routine w reflex microscopic -Urine, Clean Catch     Status: Abnormal   Collection Time: 12/26/22  8:22 PM  Result Value Ref Range   Color, Urine AMBER (A) YELLOW   APPearance HAZY (A) CLEAR   Specific Gravity, Urine 1.032 (H) 1.005 - 1.030   pH 5.0 5.0 - 8.0   Glucose, UA NEGATIVE NEGATIVE mg/dL   Hgb urine dipstick NEGATIVE NEGATIVE   Bilirubin Urine SMALL (A) NEGATIVE   Ketones, ur 80 (A) NEGATIVE mg/dL   Protein, ur 025 (A) NEGATIVE mg/dL   Nitrite NEGATIVE NEGATIVE   Leukocytes,Ua NEGATIVE NEGATIVE   RBC / HPF 6-10 0 - 5 RBC/hpf   WBC, UA 0-5 0 - 5 WBC/hpf   Bacteria, UA RARE (A) NONE SEEN   Squamous Epithelial / HPF 0-5 0 - 5 /HPF   Mucus  PRESENT      MDM - UA reflexed to culture - 80 of Ketones and high specific gravity. 2 Bags of IV fluids ordered.  - Antiemetics ordered.  - Patient reports feeling " 10 times better and desires to go  home."  - Plan for discharge.   Assessment and Plan   1. Nausea and vomiting during pregnancy   2. [redacted] weeks gestation of pregnancy   3. Dehydration    - Reviewed nausea and vomiting as a normal discomfort of pregnancy.  - Discussed comfort measures and OTC options for nausea like seabands.  - Recommended that patient continue use of Zofran.  - Rx for scop patch sent to outpatient pharmacy.  - Worsening signs and return precautions reviewed. - Patient discharged home in stable condition and may return to MAU as needed.   Claudette Head, MSN CNM  12/26/2022, 10:32 PM

## 2022-12-27 DIAGNOSIS — O219 Vomiting of pregnancy, unspecified: Secondary | ICD-10-CM

## 2022-12-27 DIAGNOSIS — Z3A08 8 weeks gestation of pregnancy: Secondary | ICD-10-CM

## 2022-12-27 DIAGNOSIS — E860 Dehydration: Secondary | ICD-10-CM

## 2022-12-27 MED ORDER — TRANSDERM-SCOP 1 MG/3DAYS TD PT72: 1.0000 | MEDICATED_PATCH | TRANSDERMAL | 1 refills | Status: DC

## 2022-12-27 NOTE — Progress Notes (Signed)
Written and verbal d/c instructions given and understanding voiced. 

## 2022-12-28 LAB — CULTURE, OB URINE

## 2023-01-08 DIAGNOSIS — Z3481 Encounter for supervision of other normal pregnancy, first trimester: Secondary | ICD-10-CM | POA: Diagnosis not present

## 2023-01-08 DIAGNOSIS — R112 Nausea with vomiting, unspecified: Secondary | ICD-10-CM | POA: Diagnosis not present

## 2023-01-08 DIAGNOSIS — Z113 Encounter for screening for infections with a predominantly sexual mode of transmission: Secondary | ICD-10-CM | POA: Diagnosis not present

## 2023-01-08 DIAGNOSIS — Z362 Encounter for other antenatal screening follow-up: Secondary | ICD-10-CM | POA: Diagnosis not present

## 2023-01-08 DIAGNOSIS — N925 Other specified irregular menstruation: Secondary | ICD-10-CM | POA: Diagnosis not present

## 2023-01-08 LAB — HEPATITIS C ANTIBODY: HCV Ab: NEGATIVE

## 2023-01-08 LAB — OB RESULTS CONSOLE RUBELLA ANTIBODY, IGM: Rubella: IMMUNE

## 2023-01-08 LAB — OB RESULTS CONSOLE GC/CHLAMYDIA
Chlamydia: NEGATIVE
Neisseria Gonorrhea: NEGATIVE

## 2023-01-08 LAB — OB RESULTS CONSOLE ANTIBODY SCREEN: Antibody Screen: NEGATIVE

## 2023-01-08 LAB — OB RESULTS CONSOLE HIV ANTIBODY (ROUTINE TESTING): HIV: NONREACTIVE

## 2023-01-08 LAB — OB RESULTS CONSOLE HEPATITIS B SURFACE ANTIGEN: Hepatitis B Surface Ag: NEGATIVE

## 2023-01-08 LAB — OB RESULTS CONSOLE RPR: RPR: NONREACTIVE

## 2023-02-05 DIAGNOSIS — E559 Vitamin D deficiency, unspecified: Secondary | ICD-10-CM | POA: Diagnosis not present

## 2023-02-07 DIAGNOSIS — E559 Vitamin D deficiency, unspecified: Secondary | ICD-10-CM | POA: Insufficient documentation

## 2023-02-18 ENCOUNTER — Inpatient Hospital Stay (HOSPITAL_COMMUNITY)
Admission: AD | Admit: 2023-02-18 | Discharge: 2023-02-18 | Disposition: A | Discharge status: Home / Self Care | Payer: Medicaid Other | Attending: Obstetrics and Gynecology | Admitting: Obstetrics and Gynecology

## 2023-02-18 ENCOUNTER — Encounter (HOSPITAL_COMMUNITY): Payer: Self-pay | Admitting: Obstetrics and Gynecology

## 2023-02-18 DIAGNOSIS — O219 Vomiting of pregnancy, unspecified: Secondary | ICD-10-CM | POA: Diagnosis not present

## 2023-02-18 DIAGNOSIS — Z3A16 16 weeks gestation of pregnancy: Secondary | ICD-10-CM | POA: Diagnosis not present

## 2023-02-18 DIAGNOSIS — O26892 Other specified pregnancy related conditions, second trimester: Secondary | ICD-10-CM | POA: Diagnosis not present

## 2023-02-18 DIAGNOSIS — O10912 Unspecified pre-existing hypertension complicating pregnancy, second trimester: Secondary | ICD-10-CM | POA: Diagnosis not present

## 2023-02-18 DIAGNOSIS — R109 Unspecified abdominal pain: Secondary | ICD-10-CM | POA: Diagnosis not present

## 2023-02-18 DIAGNOSIS — O26899 Other specified pregnancy related conditions, unspecified trimester: Secondary | ICD-10-CM

## 2023-02-18 LAB — URINALYSIS, ROUTINE W REFLEX MICROSCOPIC
Bilirubin Urine: NEGATIVE
Glucose, UA: NEGATIVE mg/dL
Hgb urine dipstick: NEGATIVE
Ketones, ur: NEGATIVE mg/dL
Nitrite: NEGATIVE
Protein, ur: NEGATIVE mg/dL
Specific Gravity, Urine: 1.016 (ref 1.005–1.030)
pH: 6 (ref 5.0–8.0)

## 2023-02-18 LAB — WET PREP, GENITAL
Clue Cells Wet Prep HPF POC: NONE SEEN
Sperm: NONE SEEN
Trich, Wet Prep: NONE SEEN
WBC, Wet Prep HPF POC: >=10 — AB (ref <10)
Yeast Wet Prep HPF POC: NONE SEEN

## 2023-02-18 MED ORDER — ACETAMINOPHEN 500 MG PO TABS
1000.0000 mg | ORAL_TABLET | Freq: Once | ORAL | Status: AC
  Administered 2023-02-18: 1000 mg via ORAL

## 2023-02-18 MED ORDER — METOCLOPRAMIDE HCL 10 MG PO TABS
10.0000 mg | ORAL_TABLET | Freq: Once | ORAL | Status: AC
  Administered 2023-02-18: 10 mg via ORAL
  Filled 2023-02-18: qty 1

## 2023-02-18 MED ORDER — FAMOTIDINE 20 MG PO TABS: 20.0000 mg | ORAL_TABLET | Freq: Two times a day (BID) | ORAL | 0 refills | Status: DC

## 2023-02-18 MED ORDER — CYCLOBENZAPRINE HCL 5 MG PO TABS
5.0000 mg | ORAL_TABLET | Freq: Once | ORAL | Status: AC
  Administered 2023-02-18: 5 mg via ORAL
  Filled 2023-02-18: qty 1

## 2023-02-18 MED ORDER — FAMOTIDINE 20 MG PO TABS
20.0000 mg | ORAL_TABLET | Freq: Once | ORAL | Status: AC
  Administered 2023-02-18: 20 mg via ORAL
  Filled 2023-02-18: qty 1

## 2023-02-18 MED ORDER — ONDANSETRON 4 MG PO TBDP
4.0000 mg | ORAL_TABLET | Freq: Once | ORAL | Status: AC
  Administered 2023-02-18: 4 mg via ORAL
  Filled 2023-02-18: qty 1

## 2023-02-18 MED ORDER — CYCLOBENZAPRINE HCL 10 MG PO TABS: 10.0000 mg | ORAL_TABLET | Freq: Two times a day (BID) | ORAL | 0 refills | Status: DC | PRN

## 2023-02-18 MED ORDER — METOCLOPRAMIDE HCL 10 MG PO TABS: 10.0000 mg | ORAL_TABLET | Freq: Four times a day (QID) | ORAL | 0 refills | Status: DC

## 2023-02-18 NOTE — MAU Note (Signed)
Monique Keith is a 27 y.o. at [redacted]w[redacted]d here in MAU reporting: been having pain in lower abd, above pubic bone. Comes and goes.  Started last Mon, getting worse.has been really nauseated.  Denies bleeding or d/c. Some pressure with urination.  Denies diarrhea, "has been real backed up".  Last BM was 3 days ago.  Also having pain in lower back and has a hard time standing upright Onset of complaint: last Mon Pain score: 6 Vitals:   02/18/23 1535  BP: 118/74  Pulse: 94  Resp: 17  Temp: 98.5 F (36.9 C)  SpO2: 100%     FHT:154 Lab orders placed from triage:  UA

## 2023-02-18 NOTE — MAU Provider Note (Signed)
History     CSN: [id number]  Arrival date and time: 02/18/23 1500   None     Chief Complaint  Patient presents with   Abdominal Pain   HPI Monique Keith is a 27 y.o. G2P1001 at [redacted]w[redacted]d who presents to MAU for abdominal pain. She reports constant sharp abdominal pain that started on Monday however has progressively worsened throughout the week. She initially thought she had moved a wrong way. She reports lying on her right side worsens the pain, however lying on the left side helps. She has not taken anything to relieve the pain. She denies vaginal bleeding, discharge, itching, odor, or urinary s/s. She reports she has had some constipation due to taking Zofran, however she has been using Miralax with good results. She reports last BM was yesterday. She denies recent intercourse.   Patient receives [facility], next appointment is on 6/25.  OB History     Gravida  2   Para  1   Term  1   Preterm      AB      Living  1      SAB      IAB      Ectopic      Multiple      Live Births  1           Past Medical History:  Diagnosis Date   Anemia    Chlamydia    Gonorrhea    Headache    Hypertension    Hypoglycemia    Ovarian cyst     Past Surgical History:  Procedure Laterality Date   CESAREAN SECTION N/A 06/03/2019   Procedure: CESAREAN SECTION;  Surgeon: Gerald Leitz, MD;  Location: [facility];  Service: Obstetrics;  Laterality: N/A;   HERNIA REPAIR     UMBILICAL HERNIA REPAIR      Family History  Problem Relation Age of Onset   Asthma Mother    Heart disease Father    Hypertension Maternal Grandmother    Hypertension Maternal Grandfather    Hyperlipidemia Maternal Grandfather    Diabetes Maternal Grandfather     Social History   Tobacco Use   Smoking status: Former    Packs/day: .25    Types: Cigarettes    Quit date: 10/08/2018    Years since quitting: 4.3   Smokeless tobacco: Never  Vaping Use   Vaping Use: Never used  Substance Use Topics    Alcohol use: No    Alcohol/week: 0.0 standard drinks of alcohol   Drug use: Not Currently    Types: Marijuana    Comment: not since first preg    Allergies: No Known Allergies  No medications prior to admission.   Review of Systems  Constitutional: Negative.   Gastrointestinal:  Positive for abdominal pain, constipation and nausea.  All other systems reviewed and are negative.   Physical Exam   Blood pressure 108/62, pulse 64, temperature 98.5 F (36.9 C), temperature source Oral, resp. rate 17, height 5\' 4"  (1.626 m), weight 64.5 kg, last menstrual period 10/28/2022, SpO2 100 %.  Physical Exam Vitals and nursing note reviewed.  Constitutional:      General: She is not in acute distress. Cardiovascular:     Rate and Rhythm: Normal rate.  Pulmonary:     Effort: Pulmonary effort is normal. No respiratory distress.  Abdominal:     Palpations: Abdomen is soft.     Tenderness: There is abdominal tenderness in the left lower  quadrant. There is no guarding or rebound. Negative signs include Murphy's sign.  Genitourinary:    Comments: Blind swabs collected Skin:    General: Skin is warm and dry.  Neurological:     General: No focal deficit present.     Mental Status: She is alert and oriented to person, place, and time.  Psychiatric:        Mood and Affect: Mood normal.        Behavior: Behavior normal.   Dilation: Closed Effacement (%): Thick Cervical Position: Posterior Exam by:: Lysle Dingwall CNM  Results for orders placed or performed during the hospital encounter of 02/18/23 (from the past 24 hour(s))  Urinalysis, Routine w reflex microscopic -Urine, Clean Catch     Status: Abnormal   Collection Time: 02/18/23  3:31 PM  Result Value Ref Range   Color, Urine YELLOW YELLOW   APPearance HAZY (A) CLEAR   Specific Gravity, Urine 1.016 1.005 - 1.030   pH 6.0 5.0 - 8.0   Glucose, UA NEGATIVE NEGATIVE mg/dL   Hgb urine dipstick NEGATIVE NEGATIVE   Bilirubin Urine  NEGATIVE NEGATIVE   Ketones, ur NEGATIVE NEGATIVE mg/dL   Protein, ur NEGATIVE NEGATIVE mg/dL   Nitrite NEGATIVE NEGATIVE   Leukocytes,Ua SMALL (A) NEGATIVE   RBC / HPF 0-5 0 - 5 RBC/hpf   WBC, UA 0-5 0 - 5 WBC/hpf   Bacteria, UA MANY (A) NONE SEEN   Squamous Epithelial / HPF 0-5 0 - 5 /HPF   Mucus PRESENT    Hyaline Casts, UA PRESENT   Wet prep, genital     Status: Abnormal   Collection Time: 02/18/23  4:19 PM  Result Value Ref Range   Yeast Wet Prep HPF POC NONE SEEN NONE SEEN   Trich, Wet Prep NONE SEEN NONE SEEN   Clue Cells Wet Prep HPF POC NONE SEEN NONE SEEN   WBC, Wet Prep HPF POC >=10 (A) <10   Sperm NONE SEEN    FHR: 154 bpm via doppler  MAU Course  Procedures  MDM UA, culture Wet prep, GC/CT Tylenol/Flexeril Zofran ODT, Reglan, Pepcid  UA positive for bacteria but otherwise negative. Culture added. Wet prep negative. GC/CT pending. Cervix closed/thick. Patient was given Tylenol and Flexeril which resolved pain completely, however patient became nauseous and vomited during stay. She was given Zofran ODT which did not help and she continued to vomit. She reported a lot of reflux so Reglan and Pepcid were ordered. On reassessment, patient reports feeling much better and requesting to be discharged home.   Assessment and Plan   1. [redacted] weeks gestation of pregnancy   2. Abdominal pain affecting pregnancy   3. Nausea and vomiting during pregnancy prior to [redacted] weeks gestation    - Discharge home in stable condition - Rx for Flexeril prn, Reglan and Pepcid - Continue Miralax prn. D/c Zofran if not helping and causing constipation. Increase fiber/water intake. May use stool softener prn - Return precautions given. Return to MAU as needed - Keep OB appointment as scheduled  Brand Males, CNM 02/18/2023, 7:34 PM

## 2023-02-19 LAB — GC/CHLAMYDIA PROBE AMP (~~LOC~~) NOT AT ARMC
Chlamydia: NEGATIVE
Comment: NEGATIVE
Comment: NORMAL
Neisseria Gonorrhea: NEGATIVE

## 2023-02-19 LAB — CULTURE, OB URINE

## 2023-02-25 ENCOUNTER — Other Ambulatory Visit: Payer: Self-pay | Admitting: Obstetrics and Gynecology

## 2023-02-25 MED ORDER — FAMOTIDINE 20 MG PO TABS: 20.0000 mg | ORAL_TABLET | Freq: Two times a day (BID) | ORAL | 3 refills | Status: DC

## 2023-02-25 MED ORDER — ONDANSETRON 4 MG PO TBDP: 4.0000 mg | ORAL_TABLET | Freq: Three times a day (TID) | ORAL | 3 refills | Status: DC | PRN

## 2023-03-06 ENCOUNTER — Inpatient Hospital Stay (HOSPITAL_COMMUNITY)
Admission: AD | Admit: 2023-03-06 | Discharge: 2023-03-07 | Disposition: A | Discharge status: Home / Self Care | Payer: Medicaid Other | Attending: Obstetrics and Gynecology | Admitting: Obstetrics and Gynecology

## 2023-03-06 DIAGNOSIS — S7012XA Contusion of left thigh, initial encounter: Secondary | ICD-10-CM | POA: Insufficient documentation

## 2023-03-06 DIAGNOSIS — O9A212 Injury, poisoning and certain other consequences of external causes complicating pregnancy, second trimester: Secondary | ICD-10-CM | POA: Insufficient documentation

## 2023-03-06 DIAGNOSIS — O26892 Other specified pregnancy related conditions, second trimester: Secondary | ICD-10-CM | POA: Insufficient documentation

## 2023-03-06 DIAGNOSIS — Z3A18 18 weeks gestation of pregnancy: Secondary | ICD-10-CM | POA: Insufficient documentation

## 2023-03-06 DIAGNOSIS — S8012XA Contusion of left lower leg, initial encounter: Secondary | ICD-10-CM

## 2023-03-06 DIAGNOSIS — W19XXXA Unspecified fall, initial encounter: Secondary | ICD-10-CM | POA: Insufficient documentation

## 2023-03-06 DIAGNOSIS — R109 Unspecified abdominal pain: Secondary | ICD-10-CM | POA: Insufficient documentation

## 2023-03-06 DIAGNOSIS — S301XXA Contusion of abdominal wall, initial encounter: Secondary | ICD-10-CM | POA: Insufficient documentation

## 2023-03-06 NOTE — MAU Note (Signed)
.  Monique Keith is a 27 y.o. at [redacted]w[redacted]d here in MAU reporting a fall today at 0700. A vent cover at home is missing and she stepped into it with her left leg and hit her abdomen as well. Having abdominal pain all day. Denies VB. Having pain in L upper leg and Left side as well as abdominal pain  Onset of complaint: 0700 Pain score: 7 Vitals:   03/06/23 2242 03/06/23 2245  BP:  132/77  Pulse: 65   Resp: 17   Temp: 98.2 F (36.8 C)   SpO2: 100%      FHT:152 Lab orders placed from triage:  u/a

## 2023-03-07 DIAGNOSIS — S301XXA Contusion of abdominal wall, initial encounter: Secondary | ICD-10-CM

## 2023-03-07 DIAGNOSIS — Z3A18 18 weeks gestation of pregnancy: Secondary | ICD-10-CM

## 2023-03-07 DIAGNOSIS — O26892 Other specified pregnancy related conditions, second trimester: Secondary | ICD-10-CM

## 2023-03-07 DIAGNOSIS — S8012XA Contusion of left lower leg, initial encounter: Secondary | ICD-10-CM

## 2023-03-07 DIAGNOSIS — W19XXXA Unspecified fall, initial encounter: Secondary | ICD-10-CM

## 2023-03-07 DIAGNOSIS — S7012XA Contusion of left thigh, initial encounter: Secondary | ICD-10-CM | POA: Diagnosis not present

## 2023-03-07 DIAGNOSIS — O9A212 Injury, poisoning and certain other consequences of external causes complicating pregnancy, second trimester: Secondary | ICD-10-CM | POA: Diagnosis not present

## 2023-03-07 DIAGNOSIS — Z8679 Personal history of other diseases of the circulatory system: Secondary | ICD-10-CM | POA: Insufficient documentation

## 2023-03-07 DIAGNOSIS — R109 Unspecified abdominal pain: Secondary | ICD-10-CM | POA: Diagnosis not present

## 2023-03-07 MED ORDER — ACETAMINOPHEN 325 MG PO TABS
650.0000 mg | ORAL_TABLET | Freq: Once | ORAL | Status: AC
  Administered 2023-03-07: 650 mg via ORAL
  Filled 2023-03-07: qty 2

## 2023-03-07 NOTE — MAU Provider Note (Signed)
Chief Complaint:  Fall   Event Date/Time   First Provider Initiated Contact with Patient 03/07/23 0050     HPI: Monique Keith is a 27 y.o. G2P1001 at 12w4dwho presents to maternity admissions reporting falling into a vent at her home this morning at 7am.  States the cover was missing and her left leg and almost entire left side of her abdomen went into the vent opening.  Has had pain in her leg and left side of abdomen all day.  Did not take anything for it. . She denies LOF, vaginal bleeding, urinary symptoms, h/a, n/v, diarrhea, constipation or fever/chills.   Fall The accident occurred 12 to 24 hours ago. She fell from a height of 3 to 5 ft. There was no blood loss. Point of impact: left leg and left abdomen. The symptoms are aggravated by movement and pressure on injury. Associated symptoms include abdominal pain. Pertinent negatives include no fever or headaches. She has tried nothing for the symptoms.   RN Note: Monique Keith is a 27 y.o. at [redacted]w[redacted]d here in MAU reporting a fall today at 0700. A vent cover at home is missing and she stepped into it with her left leg and hit her abdomen as well. Having abdominal pain all day. Denies VB. Having pain in L upper leg and Left side as well as abdominal pain             Onset of complaint: 0700                        Pain score: 7  Past Medical History: Past Medical History:  Diagnosis Date   Anemia    Chlamydia    Gonorrhea    Headache    Hypertension    Hypoglycemia    Ovarian cyst     Past obstetric history: OB History  Gravida Para Term Preterm AB Living  2 1 1     1   SAB IAB Ectopic Multiple Live Births          1    # Outcome Date GA Lbr Len/2nd Weight Sex Delivery Anes PTL Lv  2 Current           1 Term 06/03/19 [redacted]w[redacted]d   F CS-Unspec  N LIV    Past Surgical History: Past Surgical History:  Procedure Laterality Date   CESAREAN SECTION N/A 06/03/2019   Procedure: CESAREAN SECTION;  Surgeon: Gerald Leitz, MD;  Location: [facility]  [facility];  Service: Obstetrics;  Laterality: N/A;   HERNIA REPAIR     UMBILICAL HERNIA REPAIR      Family History: Family History  Problem Relation Age of Onset   Asthma Mother    Heart disease Father    Hypertension Maternal Grandmother    Hypertension Maternal Grandfather    Hyperlipidemia Maternal Grandfather    Diabetes Maternal Grandfather     Social History: Social History   Tobacco Use   Smoking status: Former    Packs/day: .25    Types: Cigarettes    Quit date: 10/08/2018    Years since quitting: 4.4   Smokeless tobacco: Never  Vaping Use   Vaping Use: Never used  Substance Use Topics   Alcohol use: No    Alcohol/week: 0.0 standard drinks of alcohol   Drug use: Not Currently    Types: Marijuana    Comment: not since first preg    Allergies: No Known Allergies  Meds:  Medications Prior to Admission  Medication Sig Dispense Refill Last Dose   cyclobenzaprine (FLEXERIL) 10 MG tablet Take 1 tablet (10 mg total) by mouth 2 (two) times daily as needed for muscle spasms. 20 tablet 0    famotidine (PEPCID) 20 MG tablet Take 1 tablet (20 mg total) by mouth 2 (two) times daily. 60 tablet 3    metoCLOPramide (REGLAN) 10 MG tablet Take 1 tablet (10 mg total) by mouth every 6 (six) hours. 30 tablet 0    ondansetron (ZOFRAN-ODT) 4 MG disintegrating tablet Take 1 tablet (4 mg total) by mouth every 8 (eight) hours as needed for nausea or vomiting. 60 tablet 3    polyethylene glycol powder (GLYCOLAX/MIRALAX) 17 GM/SCOOP powder Take 17 g by mouth 2 (two) times daily as needed for moderate constipation. 500 g 3    promethazine (PHENERGAN) 25 MG tablet Take 1 tablet (25 mg total) by mouth every 6 (six) hours as needed for nausea or vomiting. (Patient not taking: Reported on 12/26/2022) 30 tablet 6    scopolamine (TRANSDERM-SCOP) 1 MG/3DAYS Place 1 patch (1.5 mg total) onto the skin every 3 (three) days. 10 patch 1    sodium phosphate (FLEET) 7-19 GM/118ML ENEM Place 133 mLs (1 enema  total) rectally daily as needed for severe constipation. 133 mL 3     I have reviewed patient's Past Medical Hx, Surgical Hx, Family Hx, Social Hx, medications and allergies.   ROS:  Review of Systems  Constitutional:  Negative for fever.  Gastrointestinal:  Positive for abdominal pain.  Neurological:  Negative for headaches.   Other systems negative  Physical Exam  Patient Vitals for the past 24 hrs:  BP Temp Pulse Resp SpO2 Height Weight  03/06/23 2245 132/77 -- -- -- -- -- --  03/06/23 2242 -- 98.2 F (36.8 C) 65 17 100 % 5\' 4"  (1.626 m) 64.4 kg   Constitutional: Well-developed, well-nourished female in no acute distress.  Cardiovascular: normal rate  Respiratory: normal effort GI: Abd soft, tender over left side of abdominal wall, gravid appropriate for gestational age.   No rebound or guarding. MS: Extremities nontender  except over left lateral thigh which has contusion and abrasions,  normal ROM Neurologic: Alert and oriented x 4.  GU: Neg CVAT.  PELVIC EXAM:  Cervix closed and long  FHT:  152   Labs: No results found for this or any previous visit (from the past 24 hour(s)).    Imaging:  Pt informed that the ultrasound is considered a limited OB ultrasound and is not intended to be a complete ultrasound exam.  Patient also informed that the ultrasound is not being completed with the intent of assessing for fetal or placental anomalies or any pelvic abnormalities.  Explained that the purpose of today's ultrasound is to assess for presentation, BPP and amniotic fluid volume.  Patient acknowledges the purpose of the exam and the limitations of the study.    Single fetus visualized Fetus is active FHR 160s Posterior placenta   MAU Course/MDM: I have reviewed the triage vital signs and the nursing notes.   Pertinent labs & imaging results that were available during my care of the patient were reviewed by me and considered in my medical decision making (see chart for  details).      I have reviewed her medical records including past results, notes and treatments.  Consult Dr Jolayne Panther with presentation, exam findings and test results. She does not think more imaging is necessary at this time. Treatments in MAU included Bedside  US.    Assessment: Single IUP at [redacted]w[redacted]d Status post fall Left abdominal wall contusion Left thigh contusion  Plan: Discharge home Ice to contusions Tylenol for pain Follow up in Office for prenatal visits and recheck Encouraged to return if she develops worsening of symptoms, increase in pain, fever, or other concerning symptoms.   Pt stable at time of discharge.  Wynelle Bourgeois CNM, MSN Certified Nurse-Midwife 03/07/2023 12:50 AM

## 2023-03-12 ENCOUNTER — Other Ambulatory Visit: Payer: Self-pay | Admitting: Obstetrics and Gynecology

## 2023-03-12 DIAGNOSIS — Z363 Encounter for antenatal screening for malformations: Secondary | ICD-10-CM

## 2023-03-12 DIAGNOSIS — O162 Unspecified maternal hypertension, second trimester: Secondary | ICD-10-CM

## 2023-03-12 DIAGNOSIS — Z98891 History of uterine scar from previous surgery: Secondary | ICD-10-CM

## 2023-03-13 ENCOUNTER — Other Ambulatory Visit: Payer: Self-pay | Admitting: *Deleted

## 2023-03-13 ENCOUNTER — Ambulatory Visit: Payer: Medicaid Other | Admitting: *Deleted

## 2023-03-13 ENCOUNTER — Ambulatory Visit: Payer: Medicaid Other | Attending: Obstetrics and Gynecology

## 2023-03-13 VITALS — BP 120/72 | HR 98

## 2023-03-13 DIAGNOSIS — Z363 Encounter for antenatal screening for malformations: Secondary | ICD-10-CM | POA: Diagnosis not present

## 2023-03-13 DIAGNOSIS — Z98891 History of uterine scar from previous surgery: Secondary | ICD-10-CM | POA: Insufficient documentation

## 2023-03-13 DIAGNOSIS — Z8679 Personal history of other diseases of the circulatory system: Secondary | ICD-10-CM | POA: Diagnosis not present

## 2023-03-13 DIAGNOSIS — Z3A19 19 weeks gestation of pregnancy: Secondary | ICD-10-CM | POA: Diagnosis not present

## 2023-03-13 DIAGNOSIS — O34219 Maternal care for unspecified type scar from previous cesarean delivery: Secondary | ICD-10-CM

## 2023-03-13 DIAGNOSIS — Z8759 Personal history of other complications of pregnancy, childbirth and the puerperium: Secondary | ICD-10-CM

## 2023-03-13 DIAGNOSIS — O09292 Supervision of pregnancy with other poor reproductive or obstetric history, second trimester: Secondary | ICD-10-CM

## 2023-03-13 DIAGNOSIS — Z362 Encounter for other antenatal screening follow-up: Secondary | ICD-10-CM

## 2023-03-13 DIAGNOSIS — O162 Unspecified maternal hypertension, second trimester: Secondary | ICD-10-CM | POA: Diagnosis not present

## 2023-03-13 DIAGNOSIS — O43192 Other malformation of placenta, second trimester: Secondary | ICD-10-CM

## 2023-03-13 DIAGNOSIS — D649 Anemia, unspecified: Secondary | ICD-10-CM

## 2023-03-13 DIAGNOSIS — O99012 Anemia complicating pregnancy, second trimester: Secondary | ICD-10-CM | POA: Diagnosis not present

## 2023-04-17 ENCOUNTER — Ambulatory Visit: Payer: Medicaid Other | Attending: Maternal & Fetal Medicine

## 2023-04-17 ENCOUNTER — Other Ambulatory Visit: Payer: Self-pay | Admitting: *Deleted

## 2023-04-17 DIAGNOSIS — O09292 Supervision of pregnancy with other poor reproductive or obstetric history, second trimester: Secondary | ICD-10-CM

## 2023-04-17 DIAGNOSIS — O99012 Anemia complicating pregnancy, second trimester: Secondary | ICD-10-CM

## 2023-04-17 DIAGNOSIS — O34219 Maternal care for unspecified type scar from previous cesarean delivery: Secondary | ICD-10-CM | POA: Diagnosis not present

## 2023-04-17 DIAGNOSIS — Z3A24 24 weeks gestation of pregnancy: Secondary | ICD-10-CM

## 2023-04-17 DIAGNOSIS — Z8759 Personal history of other complications of pregnancy, childbirth and the puerperium: Secondary | ICD-10-CM | POA: Diagnosis not present

## 2023-04-17 DIAGNOSIS — Z362 Encounter for other antenatal screening follow-up: Secondary | ICD-10-CM | POA: Insufficient documentation

## 2023-04-17 DIAGNOSIS — O09299 Supervision of pregnancy with other poor reproductive or obstetric history, unspecified trimester: Secondary | ICD-10-CM

## 2023-04-17 DIAGNOSIS — O43192 Other malformation of placenta, second trimester: Secondary | ICD-10-CM

## 2023-04-17 DIAGNOSIS — Z8679 Personal history of other diseases of the circulatory system: Secondary | ICD-10-CM | POA: Insufficient documentation

## 2023-04-17 DIAGNOSIS — D649 Anemia, unspecified: Secondary | ICD-10-CM

## 2023-04-25 DIAGNOSIS — O3680X9 Pregnancy with inconclusive fetal viability, other fetus: Secondary | ICD-10-CM | POA: Diagnosis not present

## 2023-04-25 DIAGNOSIS — Z3A25 25 weeks gestation of pregnancy: Secondary | ICD-10-CM | POA: Diagnosis not present

## 2023-05-15 DIAGNOSIS — Z369 Encounter for antenatal screening, unspecified: Secondary | ICD-10-CM | POA: Diagnosis not present

## 2023-05-22 ENCOUNTER — Other Ambulatory Visit: Payer: Self-pay | Admitting: *Deleted

## 2023-05-22 ENCOUNTER — Ambulatory Visit: Payer: Medicaid Other | Attending: Obstetrics

## 2023-05-22 DIAGNOSIS — O99013 Anemia complicating pregnancy, third trimester: Secondary | ICD-10-CM | POA: Diagnosis not present

## 2023-05-22 DIAGNOSIS — Z8759 Personal history of other complications of pregnancy, childbirth and the puerperium: Secondary | ICD-10-CM

## 2023-05-22 DIAGNOSIS — O43193 Other malformation of placenta, third trimester: Secondary | ICD-10-CM | POA: Diagnosis not present

## 2023-05-22 DIAGNOSIS — O09293 Supervision of pregnancy with other poor reproductive or obstetric history, third trimester: Secondary | ICD-10-CM | POA: Diagnosis not present

## 2023-05-22 DIAGNOSIS — O09299 Supervision of pregnancy with other poor reproductive or obstetric history, unspecified trimester: Secondary | ICD-10-CM | POA: Diagnosis not present

## 2023-05-22 DIAGNOSIS — O43192 Other malformation of placenta, second trimester: Secondary | ICD-10-CM | POA: Insufficient documentation

## 2023-05-22 DIAGNOSIS — D649 Anemia, unspecified: Secondary | ICD-10-CM | POA: Diagnosis not present

## 2023-05-22 DIAGNOSIS — O34219 Maternal care for unspecified type scar from previous cesarean delivery: Secondary | ICD-10-CM | POA: Insufficient documentation

## 2023-05-22 DIAGNOSIS — O09292 Supervision of pregnancy with other poor reproductive or obstetric history, second trimester: Secondary | ICD-10-CM | POA: Insufficient documentation

## 2023-05-22 DIAGNOSIS — Z3A29 29 weeks gestation of pregnancy: Secondary | ICD-10-CM | POA: Diagnosis not present

## 2023-05-27 ENCOUNTER — Encounter: Payer: Self-pay | Admitting: Family Medicine

## 2023-05-27 MED ORDER — ONDANSETRON 4 MG PO TBDP: 4.0000 mg | ORAL_TABLET | Freq: Three times a day (TID) | ORAL | 3 refills | Status: DC | PRN

## 2023-06-11 ENCOUNTER — Other Ambulatory Visit: Payer: Self-pay | Admitting: Obstetrics and Gynecology

## 2023-06-18 DIAGNOSIS — O43199 Other malformation of placenta, unspecified trimester: Secondary | ICD-10-CM | POA: Insufficient documentation

## 2023-06-26 ENCOUNTER — Ambulatory Visit: Payer: Medicaid Other | Attending: Obstetrics

## 2023-06-26 DIAGNOSIS — O09293 Supervision of pregnancy with other poor reproductive or obstetric history, third trimester: Secondary | ICD-10-CM | POA: Diagnosis not present

## 2023-06-26 DIAGNOSIS — D649 Anemia, unspecified: Secondary | ICD-10-CM

## 2023-06-26 DIAGNOSIS — O99013 Anemia complicating pregnancy, third trimester: Secondary | ICD-10-CM | POA: Diagnosis not present

## 2023-06-26 DIAGNOSIS — Z8759 Personal history of other complications of pregnancy, childbirth and the puerperium: Secondary | ICD-10-CM | POA: Diagnosis not present

## 2023-06-26 DIAGNOSIS — Z3A34 34 weeks gestation of pregnancy: Secondary | ICD-10-CM

## 2023-06-26 DIAGNOSIS — O43193 Other malformation of placenta, third trimester: Secondary | ICD-10-CM

## 2023-06-26 DIAGNOSIS — O43199 Other malformation of placenta, unspecified trimester: Secondary | ICD-10-CM

## 2023-06-26 DIAGNOSIS — O34219 Maternal care for unspecified type scar from previous cesarean delivery: Secondary | ICD-10-CM

## 2023-07-03 ENCOUNTER — Encounter (HOSPITAL_COMMUNITY): Payer: Self-pay | Admitting: Obstetrics and Gynecology

## 2023-07-03 ENCOUNTER — Inpatient Hospital Stay (HOSPITAL_COMMUNITY)
Admission: AD | Admit: 2023-07-03 | Discharge: 2023-07-03 | Disposition: A | Discharge status: Home / Self Care | Payer: Medicaid Other | Attending: Obstetrics and Gynecology | Admitting: Obstetrics and Gynecology

## 2023-07-03 DIAGNOSIS — O34219 Maternal care for unspecified type scar from previous cesarean delivery: Secondary | ICD-10-CM | POA: Diagnosis not present

## 2023-07-03 DIAGNOSIS — O10913 Unspecified pre-existing hypertension complicating pregnancy, third trimester: Secondary | ICD-10-CM | POA: Diagnosis not present

## 2023-07-03 DIAGNOSIS — R102 Pelvic and perineal pain: Secondary | ICD-10-CM | POA: Insufficient documentation

## 2023-07-03 DIAGNOSIS — O99891 Other specified diseases and conditions complicating pregnancy: Secondary | ICD-10-CM | POA: Diagnosis not present

## 2023-07-03 DIAGNOSIS — Z3A35 35 weeks gestation of pregnancy: Secondary | ICD-10-CM | POA: Diagnosis not present

## 2023-07-03 DIAGNOSIS — O26893 Other specified pregnancy related conditions, third trimester: Secondary | ICD-10-CM | POA: Diagnosis not present

## 2023-07-03 DIAGNOSIS — M549 Dorsalgia, unspecified: Secondary | ICD-10-CM | POA: Diagnosis not present

## 2023-07-03 LAB — URINALYSIS, ROUTINE W REFLEX MICROSCOPIC
Bilirubin Urine: NEGATIVE
Glucose, UA: NEGATIVE mg/dL
Hgb urine dipstick: NEGATIVE
Ketones, ur: NEGATIVE mg/dL
Nitrite: NEGATIVE
Protein, ur: NEGATIVE mg/dL
Specific Gravity, Urine: 1.017 (ref 1.005–1.030)
pH: 6 (ref 5.0–8.0)

## 2023-07-03 MED ORDER — CYCLOBENZAPRINE HCL 5 MG PO TABS: 10.0000 mg | ORAL_TABLET | Freq: Two times a day (BID) | ORAL | 0 refills | Status: DC | PRN

## 2023-07-03 MED ORDER — ACETAMINOPHEN 500 MG PO TABS: 500.0000 mg | ORAL_TABLET | Freq: Four times a day (QID) | ORAL | Status: DC | PRN

## 2023-07-03 NOTE — MAU Note (Signed)
..  Monique Keith is a 27 y.o. at [redacted]w[redacted]d here in MAU reporting: Ongoing pelvic pain and pressure for the past two weeks. She reports it worsens with movement and walking. Denies VB or LOF. +FM.   Has not taken anything for the pain. Next OB appointment 10/24. Scheduled for C/S 11/12.  Onset of complaint: x2 weeks Pain score: 10/10 pelvic pain  FHT:158 initial external Lab orders placed from triage: UA

## 2023-07-03 NOTE — MAU Provider Note (Signed)
History     CSN: [id number] Arrival date and time: 07/03/23 1823   Event Date/Time   First Provider Initiated Contact with Patient 07/03/23 2015      Chief Complaint  Patient presents with   Back Pain   Pelvic Pain   Back Pain Associated symptoms include pelvic pain. Pertinent negatives include no abdominal pain, dysuria or fever.  Pelvic Pain The patient's primary symptoms include pelvic pain. Associated symptoms include back pain. Pertinent negatives include no abdominal pain, chills, constipation, dysuria, fever, flank pain, hematuria, nausea or urgency.    27 y.o. female who is [redacted]w[redacted]d coming in for pelvic and low back pain as well as pelvic pressure that has been occurring for the last two weeks. Pt states she experiences a sharp pain in her pelvis any time she sits up in bed, stands up or is walking. Pt states that the pain sometimes radiates to the anterior aspect of her upper left leg. Pt had some tenderness to palpation of her lower paraspinal muscles bilaterally. Pt denied experiencing any flank pain or urinary sxs such as urinary frequency, dysuria or hematuria. Pt states that she has not attempted to take any Tylenol because she "was scared".   Pt denies VB or LOF. +FM  OB History     Gravida  2   Para  1   Term  1   Preterm      AB      Living  1      SAB      IAB      Ectopic      Multiple      Live Births  1           Past Medical History:  Diagnosis Date   Anemia    Chlamydia    Gonorrhea    Headache    Hypertension    Hypoglycemia    Ovarian cyst    Vitamin D deficiency     Past Surgical History:  Procedure Laterality Date   CESAREAN SECTION N/A 06/03/2019   Procedure: CESAREAN SECTION;  Surgeon: Gerald Leitz, MD;  Location: [facility] LD ORS;  Service: Obstetrics;  Laterality: N/A;   HERNIA REPAIR     UMBILICAL HERNIA REPAIR      Family History  Problem Relation Age of Onset   Asthma Mother    Heart disease Father    Hypertension  Maternal Grandmother    Hypertension Maternal Grandfather    Hyperlipidemia Maternal Grandfather    Diabetes Maternal Grandfather    Cancer Neg Hx     Social History   Tobacco Use   Smoking status: Former    Current packs/day: 0.00    Types: Cigarettes    Quit date: 10/08/2018    Years since quitting: 4.7   Smokeless tobacco: Never  Vaping Use   Vaping status: Never Used  Substance Use Topics   Alcohol use: No    Alcohol/week: 0.0 standard drinks of alcohol   Drug use: Not Currently    Types: Marijuana    Comment: not since first preg    Allergies: No Known Allergies  Medications Prior to Admission  Medication Sig Dispense Refill Last Dose   ondansetron (ZOFRAN-ODT) 4 MG disintegrating tablet Take 1 tablet (4 mg total) by mouth every 8 (eight) hours as needed for nausea or vomiting. 60 tablet 3 07/02/2023   polyethylene glycol powder (GLYCOLAX/MIRALAX) 17 GM/SCOOP powder Take 17 g by mouth 2 (two) times daily as needed for moderate constipation.  500 g 3    Prenatal Vit-Fe Fumarate-FA (MULTIVITAMIN-PRENATAL) 27-0.8 MG TABS tablet Take 1 tablet by mouth daily at 12 noon.   07/01/2023    Review of Systems  Constitutional:  Negative for chills and fever.  Cardiovascular:  Negative for leg swelling.  Gastrointestinal:  Negative for abdominal pain, constipation and nausea.  Genitourinary:  Positive for pelvic pain. Negative for dysuria, flank pain, hematuria and urgency.  Musculoskeletal:  Positive for back pain. Negative for gait problem.   Physical Exam   Blood pressure 125/80, pulse (!) 106, temperature 98.7 F (37.1 C), temperature source Oral, resp. rate 16, height 5\' 3"  (1.6 m), weight 78.2 kg, last menstrual period 10/31/2022, SpO2 99%.  Patient Vitals for the past 24 hrs:  BP Temp Temp src Pulse Resp SpO2 Height Weight  07/03/23 2031 135/82 -- -- 86 -- -- -- --  07/03/23 2029 -- -- -- -- -- 100 % -- --  07/03/23 2024 -- -- -- -- -- 100 % -- --  07/03/23 2019 -- --  -- -- -- 100 % -- --  07/03/23 2009 -- -- -- -- -- 100 % -- --  07/03/23 2004 -- -- -- -- -- 100 % -- --  07/03/23 1959 -- -- -- -- -- 100 % -- --  07/03/23 1954 -- -- -- -- -- 100 % -- --  07/03/23 1949 -- -- -- -- -- 99 % -- --  07/03/23 1944 -- -- -- -- -- 99 % -- --  07/03/23 1939 -- -- -- -- -- 98 % -- --  07/03/23 1934 -- -- -- -- -- 98 % -- --  07/03/23 1929 -- -- -- -- -- 98 % -- --  07/03/23 1924 -- -- -- -- -- 98 % -- --  07/03/23 1919 -- -- -- -- -- 98 % -- --  07/03/23 1914 -- -- -- -- -- 98 % -- --  07/03/23 1909 -- -- -- -- -- 98 % -- --  07/03/23 1904 -- -- -- -- -- 98 % -- --  07/03/23 1834 125/80 98.7 F (37.1 C) Oral (!) 106 16 99 % 5\' 3"  (1.6 m) 78.2 kg     Physical Exam Constitutional:      Appearance: Normal appearance.  HENT:     Head: Normocephalic and atraumatic.     Nose: Nose normal.     Mouth/Throat:     Mouth: Mucous membranes are moist.  Eyes:     General: No scleral icterus.    Conjunctiva/sclera: Conjunctivae normal.  Cardiovascular:     Rate and Rhythm: Normal rate and regular rhythm.     Pulses: Normal pulses.  Pulmonary:     Effort: Pulmonary effort is normal.  Abdominal:     Tenderness: There is no abdominal tenderness. There is no guarding.  Musculoskeletal:        General: Tenderness (on bilateral paraspinal muscles) present. Normal range of motion.     Cervical back: Normal range of motion.  Skin:    General: Skin is warm.  Neurological:     General: No focal deficit present.     Mental Status: She is alert and oriented to person, place, and time.  Psychiatric:        Mood and Affect: Mood normal.        Thought Content: Thought content normal.        Judgment: Judgment normal.       MAU Course  Procedures I reviewed the patient's  fetal monitoring.  Baseline HR: 140 Variability:  moderate Accels:present Decels: none  A/P: Reactive NST  Reassured regarding fetal status.    MDM:   This patient presents to the ED for  concern of   Chief Complaint  Patient presents with   Back Pain   Pelvic Pain    - Completed a focused physical exam to determine the extent of the patient's back and pelvic pain - Ruled out other causes of back and pelvic pain such as pyelonephritis. -Counseled the patient on non-pharmacological management of back and pelvic pain in pregnancy  I have reviewed the patients home medicines and have made adjustments as needed.   After the interventions noted above, I reevaluated the patient and found that they have: improved  Dispostion: discharged   Assessment and Plan   1. Back pain affecting pregnancy in third trimester   2. Pelvic pain affecting pregnancy in third trimester, antepartum   3. Previous cesarean delivery affecting pregnancy   4. [redacted] weeks gestation of pregnancy     Plan - Discharge home - Start 1000mg  Tylenol and Flexeril 5 mg PO at night for back and pelvic muscle pain - Consider prenatal massage, seeing a chiropractor, the use of KT tape or epsom bath soaks to help with muscle pain and tension  Brien Mates, Student-PA 07/03/2023, 8:21 PM   Attestation of Supervision of Student:  I confirm that I have verified the information documented in the physician assistant student's note and that I have also personally performed the history, physical exam and all medical decision making activities.  I have verified that all services and findings are accurately documented in this student's note; and I agree with management and plan as outlined in the documentation. I have also made any necessary editorial changes.  Federico Flake, MD [facility], [facility] 07/03/2023 9:11 PM

## 2023-07-12 DIAGNOSIS — Z362 Encounter for other antenatal screening follow-up: Secondary | ICD-10-CM | POA: Diagnosis not present

## 2023-07-12 DIAGNOSIS — Z113 Encounter for screening for infections with a predominantly sexual mode of transmission: Secondary | ICD-10-CM | POA: Diagnosis not present

## 2023-07-17 ENCOUNTER — Encounter (HOSPITAL_COMMUNITY): Payer: Self-pay

## 2023-07-17 NOTE — Patient Instructions (Addendum)
Monique Keith  07/17/2023   Your procedure is scheduled on:  07/31/2023  Arrive at 1030 at Entrance C on [address] at [facility]  and [address]. You are invited to use the FREE valet parking or use the Visitor's parking deck.  Pick up the phone at the desk and dial [phone].  Call this number if you have problems the morning of surgery: [phone]  Remember:   Do not eat food:(After Midnight) Desps de medianoche.  You may drink clear liquids until arrival at _1030____.  Clear liquids means a liquid you can see thru.  It can have color such as Cola or Kool aid.  Tea is OK and coffee as long as no milk or creamer of any kind.  Take these medicines the morning of surgery with A SIP OF WATER:  none   Do not wear jewelry, make-up or nail polish.  Do not wear lotions, powders, or perfumes. Do not wear deodorant.  Do not shave 48 hours prior to surgery.  Do not bring valuables to the hospital.  [facility] is not   responsible for any belongings or valuables brought to the hospital.  Contacts, dentures or bridgework may not be worn into surgery.  Leave suitcase in the car. After surgery it may be brought to your room.  For patients admitted to the hospital, checkout time is 11:00 AM the day of              discharge.      Please read over the following fact sheets that you were given:     Preparing for Surgery

## 2023-07-18 ENCOUNTER — Other Ambulatory Visit: Payer: Self-pay | Admitting: Emergency Medicine

## 2023-07-18 ENCOUNTER — Inpatient Hospital Stay (HOSPITAL_COMMUNITY)
Admission: AD | Admit: 2023-07-18 | Discharge: 2023-07-18 | Disposition: A | Discharge status: Home / Self Care | Payer: Medicaid Other | Attending: Obstetrics and Gynecology | Admitting: Obstetrics and Gynecology

## 2023-07-18 ENCOUNTER — Encounter (HOSPITAL_COMMUNITY): Payer: Self-pay | Admitting: Obstetrics and Gynecology

## 2023-07-18 DIAGNOSIS — O479 False labor, unspecified: Secondary | ICD-10-CM

## 2023-07-18 DIAGNOSIS — Z3A37 37 weeks gestation of pregnancy: Secondary | ICD-10-CM | POA: Diagnosis not present

## 2023-07-18 DIAGNOSIS — O471 False labor at or after 37 completed weeks of gestation: Secondary | ICD-10-CM | POA: Diagnosis not present

## 2023-07-18 NOTE — MAU Note (Signed)
Pt says feels UC's - strong since 0400  7/10 Repeat C/S- sch 07-31-2023. Vag pressure - x2 weeks - worse tonight - 10/10.  [facility]- told them about vag pressure- they told her  if it gets worse - to come here.  Has appointment today at 230pm  Feels baby moving

## 2023-07-18 NOTE — MAU Provider Note (Signed)
S: [name] is a 27 y.o. G2P1001 at [redacted]w[redacted]d  who presents to MAU today for labor evaluation.     Cervical exam by RN:  Dilation: Fingertip Effacement (%): Thick Station: Ballotable Presentation: Undeterminable Exam by:: [employer], RN  Fetal Monitoring: Baseline: 135 Variability: average Accelerations: present Decelerations: absent Contractions: occasional   MDM Discussed patient with RN. NST reviewed.   A: SIUP at [redacted]w[redacted]d  False labor  P: Discharge home Labor precautions and kick counts included in AVS Patient to follow-up with office as scheduled today Patient may return to MAU as needed or when in labor   Aviva Signs, [place] 07/18/2023 5:56 AM

## 2023-07-30 ENCOUNTER — Other Ambulatory Visit: Payer: Self-pay | Admitting: Obstetrics and Gynecology

## 2023-07-30 ENCOUNTER — Encounter (HOSPITAL_COMMUNITY): Payer: Self-pay | Admitting: Obstetrics and Gynecology

## 2023-07-30 ENCOUNTER — Encounter (HOSPITAL_COMMUNITY)
Admission: RE | Admit: 2023-07-30 | Discharge: 2023-07-30 | Disposition: A | Discharge status: Home / Self Care | Payer: Medicaid Other | Source: Ambulatory Visit | Attending: Family Medicine | Admitting: Family Medicine

## 2023-07-30 DIAGNOSIS — Z3A00 Weeks of gestation of pregnancy not specified: Secondary | ICD-10-CM | POA: Insufficient documentation

## 2023-07-30 DIAGNOSIS — Z01812 Encounter for preprocedural laboratory examination: Secondary | ICD-10-CM | POA: Insufficient documentation

## 2023-07-30 LAB — CBC
HCT: 31.8 % — ABNORMAL LOW (ref 36.0–46.0)
Hemoglobin: 10.2 g/dL — ABNORMAL LOW (ref 12.0–15.0)
MCH: 25.7 pg — ABNORMAL LOW (ref 26.0–34.0)
MCHC: 32.1 g/dL (ref 30.0–36.0)
MCV: 80.1 fL (ref 80.0–100.0)
Platelets: 345 10*3/uL (ref 150–400)
RBC: 3.97 MIL/uL (ref 3.87–5.11)
RDW: 14.5 % (ref 11.5–15.5)
WBC: 10.5 10*3/uL (ref 4.0–10.5)
nRBC: 0 % (ref 0.0–0.2)

## 2023-07-30 LAB — TYPE AND SCREEN
ABO/RH(D): A POS
Antibody Screen: NEGATIVE

## 2023-07-31 ENCOUNTER — Inpatient Hospital Stay (HOSPITAL_COMMUNITY): Payer: Medicaid Other

## 2023-07-31 ENCOUNTER — Inpatient Hospital Stay (HOSPITAL_COMMUNITY)
Admission: RE | Admit: 2023-07-31 | Discharge: 2023-08-02 | DRG: 787 | Disposition: A | Discharge status: Home / Self Care | Payer: Medicaid Other | Attending: Obstetrics and Gynecology | Admitting: Obstetrics and Gynecology

## 2023-07-31 ENCOUNTER — Encounter (HOSPITAL_COMMUNITY)
Admission: RE | Disposition: A | Discharge status: Home / Self Care | Payer: Self-pay | Source: Home / Self Care | Attending: Obstetrics and Gynecology

## 2023-07-31 ENCOUNTER — Other Ambulatory Visit: Payer: Self-pay

## 2023-07-31 ENCOUNTER — Encounter (HOSPITAL_COMMUNITY): Payer: Self-pay | Admitting: Obstetrics and Gynecology

## 2023-07-31 DIAGNOSIS — O99214 Obesity complicating childbirth: Secondary | ICD-10-CM | POA: Diagnosis not present

## 2023-07-31 DIAGNOSIS — O9081 Anemia of the puerperium: Secondary | ICD-10-CM | POA: Diagnosis not present

## 2023-07-31 DIAGNOSIS — Z3A39 39 weeks gestation of pregnancy: Secondary | ICD-10-CM | POA: Diagnosis not present

## 2023-07-31 DIAGNOSIS — Z98891 History of uterine scar from previous surgery: Principal | ICD-10-CM

## 2023-07-31 DIAGNOSIS — Z8249 Family history of ischemic heart disease and other diseases of the circulatory system: Secondary | ICD-10-CM

## 2023-07-31 DIAGNOSIS — Z833 Family history of diabetes mellitus: Secondary | ICD-10-CM | POA: Diagnosis not present

## 2023-07-31 DIAGNOSIS — Z87891 Personal history of nicotine dependence: Secondary | ICD-10-CM | POA: Diagnosis not present

## 2023-07-31 DIAGNOSIS — Z825 Family history of asthma and other chronic lower respiratory diseases: Secondary | ICD-10-CM

## 2023-07-31 DIAGNOSIS — O34219 Maternal care for unspecified type scar from previous cesarean delivery: Secondary | ICD-10-CM | POA: Diagnosis not present

## 2023-07-31 DIAGNOSIS — O43193 Other malformation of placenta, third trimester: Secondary | ICD-10-CM | POA: Diagnosis not present

## 2023-07-31 DIAGNOSIS — O34211 Maternal care for low transverse scar from previous cesarean delivery: Secondary | ICD-10-CM

## 2023-07-31 DIAGNOSIS — D62 Acute posthemorrhagic anemia: Secondary | ICD-10-CM | POA: Diagnosis not present

## 2023-07-31 LAB — RPR: RPR Ser Ql: NONREACTIVE

## 2023-07-31 SURGERY — Surgical Case
Anesthesia: Spinal

## 2023-07-31 MED ORDER — SCOPOLAMINE 1 MG/3DAYS TD PT72: 1.0000 | MEDICATED_PATCH | Freq: Once | TRANSDERMAL | Status: DC

## 2023-07-31 MED ORDER — COCONUT OIL OIL: 1.0000 | TOPICAL_OIL | Status: DC | PRN

## 2023-07-31 MED ORDER — OXYCODONE HCL 5 MG PO TABS
5.0000 mg | ORAL_TABLET | ORAL | Status: DC | PRN
  Administered 2023-08-01 (×2): 5 mg via ORAL
  Filled 2023-07-31 (×2): qty 1

## 2023-07-31 MED ORDER — SOD CITRATE-CITRIC ACID 500-334 MG/5ML PO SOLN: 30.0000 mL | ORAL | Status: DC

## 2023-07-31 MED ORDER — ONDANSETRON HCL 4 MG/2ML IJ SOLN
INTRAMUSCULAR | Status: DC | PRN
  Administered 2023-07-31: 4 mg via INTRAVENOUS

## 2023-07-31 MED ORDER — WITCH HAZEL-GLYCERIN EX PADS: 1.0000 | MEDICATED_PAD | CUTANEOUS | Status: DC | PRN

## 2023-07-31 MED ORDER — PRENATAL MULTIVITAMIN CH
1.0000 | ORAL_TABLET | Freq: Every day | ORAL | Status: DC
  Administered 2023-08-01 – 2023-08-02 (×2): 1 via ORAL
  Filled 2023-07-31 (×2): qty 1

## 2023-07-31 MED ORDER — KETOROLAC TROMETHAMINE 30 MG/ML IJ SOLN
30.0000 mg | Freq: Four times a day (QID) | INTRAMUSCULAR | Status: DC | PRN
  Administered 2023-07-31: 30 mg via INTRAVENOUS

## 2023-07-31 MED ORDER — FENTANYL CITRATE (PF) 100 MCG/2ML IJ SOLN: 25.0000 ug | INTRAMUSCULAR | Status: DC | PRN

## 2023-07-31 MED ORDER — PHENYLEPHRINE HCL-NACL 20-0.9 MG/250ML-% IV SOLN
INTRAVENOUS | Status: AC
  Filled 2023-07-31: qty 250

## 2023-07-31 MED ORDER — ACETAMINOPHEN 500 MG PO TABS
1000.0000 mg | ORAL_TABLET | Freq: Four times a day (QID) | ORAL | Status: DC
  Administered 2023-07-31 – 2023-08-02 (×7): 1000 mg via ORAL
  Filled 2023-07-31 (×7): qty 2

## 2023-07-31 MED ORDER — LACTATED RINGERS IV SOLN: INTRAVENOUS | Status: DC

## 2023-07-31 MED ORDER — FENTANYL CITRATE (PF) 100 MCG/2ML IJ SOLN: 50.0000 ug | INTRAMUSCULAR | Status: DC | PRN

## 2023-07-31 MED ORDER — KETOROLAC TROMETHAMINE 30 MG/ML IJ SOLN: 30.0000 mg | Freq: Four times a day (QID) | INTRAMUSCULAR | Status: DC | PRN

## 2023-07-31 MED ORDER — ONDANSETRON HCL 4 MG/2ML IJ SOLN
INTRAMUSCULAR | Status: AC
Start: 2023-07-31 — End: ?
  Filled 2023-07-31: qty 2

## 2023-07-31 MED ORDER — DIBUCAINE (PERIANAL) 1 % EX OINT: 1.0000 | TOPICAL_OINTMENT | CUTANEOUS | Status: DC | PRN

## 2023-07-31 MED ORDER — KETOROLAC TROMETHAMINE 30 MG/ML IJ SOLN
INTRAMUSCULAR | Status: AC
  Filled 2023-07-31: qty 1

## 2023-07-31 MED ORDER — DIPHENHYDRAMINE HCL 25 MG PO CAPS: 25.0000 mg | ORAL_CAPSULE | ORAL | Status: DC | PRN

## 2023-07-31 MED ORDER — SIMETHICONE 80 MG PO CHEW: 80.0000 mg | CHEWABLE_TABLET | ORAL | Status: DC | PRN

## 2023-07-31 MED ORDER — SCOPOLAMINE 1 MG/3DAYS TD PT72
MEDICATED_PATCH | TRANSDERMAL | Status: AC
  Filled 2023-07-31: qty 1

## 2023-07-31 MED ORDER — ZOLPIDEM TARTRATE 5 MG PO TABS: 5.0000 mg | ORAL_TABLET | Freq: Every evening | ORAL | Status: DC | PRN

## 2023-07-31 MED ORDER — ORAL CARE MOUTH RINSE: 15.0000 mL | Freq: Once | OROMUCOSAL | Status: AC

## 2023-07-31 MED ORDER — NALOXONE HCL 4 MG/10ML IJ SOLN: 1.0000 ug/kg/h | INTRAVENOUS | Status: DC | PRN

## 2023-07-31 MED ORDER — PHENYLEPHRINE HCL (PRESSORS) 10 MG/ML IV SOLN
INTRAVENOUS | Status: DC | PRN
  Administered 2023-07-31: 80 ug via INTRAVENOUS

## 2023-07-31 MED ORDER — FENTANYL CITRATE (PF) 100 MCG/2ML IJ SOLN
INTRAMUSCULAR | Status: DC | PRN
  Administered 2023-07-31: 15 ug via INTRATHECAL

## 2023-07-31 MED ORDER — OXYTOCIN-SODIUM CHLORIDE 30-0.9 UT/500ML-% IV SOLN
INTRAVENOUS | Status: AC
  Filled 2023-07-31: qty 500

## 2023-07-31 MED ORDER — FAMOTIDINE 20 MG PO TABS
20.0000 mg | ORAL_TABLET | Freq: Once | ORAL | Status: AC
Start: 2023-07-31 — End: 2023-07-31
  Administered 2023-07-31: 20 mg via ORAL

## 2023-07-31 MED ORDER — CHLORHEXIDINE GLUCONATE 0.12 % MT SOLN
OROMUCOSAL | Status: AC
Start: 2023-07-31 — End: ?
  Filled 2023-07-31: qty 15

## 2023-07-31 MED ORDER — METOCLOPRAMIDE HCL 5 MG/ML IJ SOLN
INTRAMUSCULAR | Status: AC
  Filled 2023-07-31: qty 2

## 2023-07-31 MED ORDER — MEPERIDINE HCL 25 MG/ML IJ SOLN: 6.2500 mg | INTRAMUSCULAR | Status: DC | PRN

## 2023-07-31 MED ORDER — CEFAZOLIN SODIUM-DEXTROSE 2-4 GM/100ML-% IV SOLN
2.0000 g | INTRAVENOUS | Status: AC
  Administered 2023-07-31: 2 g via INTRAVENOUS

## 2023-07-31 MED ORDER — BUPIVACAINE IN DEXTROSE 0.75-8.25 % IT SOLN
INTRATHECAL | Status: DC | PRN
  Administered 2023-07-31: 1.6 mL via INTRATHECAL

## 2023-07-31 MED ORDER — DIPHENHYDRAMINE HCL 25 MG PO CAPS: 25.0000 mg | ORAL_CAPSULE | Freq: Four times a day (QID) | ORAL | Status: DC | PRN

## 2023-07-31 MED ORDER — MORPHINE SULFATE (PF) 0.5 MG/ML IJ SOLN
INTRAMUSCULAR | Status: AC
  Filled 2023-07-31: qty 10

## 2023-07-31 MED ORDER — SIMETHICONE 80 MG PO CHEW
80.0000 mg | CHEWABLE_TABLET | Freq: Three times a day (TID) | ORAL | Status: DC
  Administered 2023-07-31 – 2023-08-02 (×5): 80 mg via ORAL
  Filled 2023-07-31 (×5): qty 1

## 2023-07-31 MED ORDER — OXYTOCIN-SODIUM CHLORIDE 30-0.9 UT/500ML-% IV SOLN
INTRAVENOUS | Status: DC | PRN
  Administered 2023-07-31: 100 mL via INTRAVENOUS

## 2023-07-31 MED ORDER — FENTANYL CITRATE (PF) 100 MCG/2ML IJ SOLN
INTRAMUSCULAR | Status: AC
  Filled 2023-07-31: qty 2

## 2023-07-31 MED ORDER — SENNOSIDES-DOCUSATE SODIUM 8.6-50 MG PO TABS
2.0000 | ORAL_TABLET | Freq: Every day | ORAL | Status: DC
  Administered 2023-08-01 – 2023-08-02 (×2): 2 via ORAL
  Filled 2023-07-31 (×2): qty 2

## 2023-07-31 MED ORDER — ACETAMINOPHEN 10 MG/ML IV SOLN
INTRAVENOUS | Status: AC
  Filled 2023-07-31: qty 100

## 2023-07-31 MED ORDER — SOD CITRATE-CITRIC ACID 500-334 MG/5ML PO SOLN
30.0000 mL | Freq: Once | ORAL | Status: AC
Start: 2023-07-31 — End: 2023-07-31
  Administered 2023-07-31: 30 mL via ORAL

## 2023-07-31 MED ORDER — KETOROLAC TROMETHAMINE 30 MG/ML IJ SOLN
30.0000 mg | Freq: Four times a day (QID) | INTRAMUSCULAR | Status: AC
Start: 2023-07-31 — End: 2023-08-01
  Administered 2023-07-31 – 2023-08-01 (×3): 30 mg via INTRAVENOUS
  Filled 2023-07-31 (×3): qty 1

## 2023-07-31 MED ORDER — OXYTOCIN-SODIUM CHLORIDE 30-0.9 UT/500ML-% IV SOLN: 2.5000 [IU]/h | INTRAVENOUS | Status: AC

## 2023-07-31 MED ORDER — ACETAMINOPHEN 10 MG/ML IV SOLN
INTRAVENOUS | Status: DC | PRN
  Administered 2023-07-31: 1000 mg via INTRAVENOUS

## 2023-07-31 MED ORDER — CHLORHEXIDINE GLUCONATE 0.12 % MT SOLN
15.0000 mL | Freq: Once | OROMUCOSAL | Status: AC
  Administered 2023-07-31: 15 mL via OROMUCOSAL

## 2023-07-31 MED ORDER — DEXMEDETOMIDINE HCL IN NACL 80 MCG/20ML IV SOLN
INTRAVENOUS | Status: DC | PRN
Start: 2023-07-31 — End: 2023-07-31
  Administered 2023-07-31: 8 ug via INTRAVENOUS

## 2023-07-31 MED ORDER — IBUPROFEN 600 MG PO TABS
600.0000 mg | ORAL_TABLET | Freq: Four times a day (QID) | ORAL | Status: DC
  Administered 2023-08-01 – 2023-08-02 (×3): 600 mg via ORAL
  Filled 2023-07-31 (×3): qty 1

## 2023-07-31 MED ORDER — PHENYLEPHRINE 80 MCG/ML (10ML) SYRINGE FOR IV PUSH (FOR BLOOD PRESSURE SUPPORT)
PREFILLED_SYRINGE | INTRAVENOUS | Status: AC
  Filled 2023-07-31: qty 10

## 2023-07-31 MED ORDER — ONDANSETRON HCL 4 MG/2ML IJ SOLN: 4.0000 mg | Freq: Once | INTRAMUSCULAR | Status: DC | PRN

## 2023-07-31 MED ORDER — AMISULPRIDE (ANTIEMETIC) 5 MG/2ML IV SOLN: 10.0000 mg | Freq: Once | INTRAVENOUS | Status: DC | PRN

## 2023-07-31 MED ORDER — NALOXONE HCL 0.4 MG/ML IJ SOLN: 0.4000 mg | INTRAMUSCULAR | Status: DC | PRN

## 2023-07-31 MED ORDER — ONDANSETRON HCL 4 MG/2ML IJ SOLN
4.0000 mg | Freq: Three times a day (TID) | INTRAMUSCULAR | Status: DC | PRN
  Administered 2023-08-02: 4 mg via INTRAVENOUS
  Filled 2023-07-31: qty 2

## 2023-07-31 MED ORDER — FAMOTIDINE 20 MG PO TABS
ORAL_TABLET | ORAL | Status: AC
  Filled 2023-07-31: qty 1

## 2023-07-31 MED ORDER — PHENYLEPHRINE HCL-NACL 20-0.9 MG/250ML-% IV SOLN
INTRAVENOUS | Status: DC | PRN
  Administered 2023-07-31: 60 ug/min via INTRAVENOUS

## 2023-07-31 MED ORDER — ACETAMINOPHEN 500 MG PO TABS: 1000.0000 mg | ORAL_TABLET | Freq: Four times a day (QID) | ORAL | Status: DC

## 2023-07-31 MED ORDER — MORPHINE SULFATE (PF) 0.5 MG/ML IJ SOLN
INTRAMUSCULAR | Status: DC | PRN
  Administered 2023-07-31: 150 ug via INTRATHECAL

## 2023-07-31 MED ORDER — DIPHENHYDRAMINE HCL 50 MG/ML IJ SOLN: 12.5000 mg | INTRAMUSCULAR | Status: DC | PRN

## 2023-07-31 MED ORDER — SCOPOLAMINE 1 MG/3DAYS TD PT72
1.0000 | MEDICATED_PATCH | Freq: Once | TRANSDERMAL | Status: DC
  Administered 2023-07-31: 1.5 mg via TRANSDERMAL

## 2023-07-31 MED ORDER — SODIUM CHLORIDE 0.9% FLUSH
3.0000 mL | INTRAVENOUS | Status: DC | PRN
Start: 2023-07-31 — End: 2023-08-02

## 2023-07-31 MED ORDER — MENTHOL 3 MG MT LOZG: 1.0000 | LOZENGE | OROMUCOSAL | Status: DC | PRN

## 2023-07-31 MED ORDER — DEXAMETHASONE SODIUM PHOSPHATE 4 MG/ML IJ SOLN
INTRAMUSCULAR | Status: AC
  Filled 2023-07-31: qty 2

## 2023-07-31 MED ORDER — CEFAZOLIN SODIUM-DEXTROSE 2-4 GM/100ML-% IV SOLN
INTRAVENOUS | Status: AC
Start: 2023-07-31 — End: ?
  Filled 2023-07-31: qty 100

## 2023-07-31 MED ORDER — SOD CITRATE-CITRIC ACID 500-334 MG/5ML PO SOLN
ORAL | Status: AC
Start: 2023-07-31 — End: ?
  Filled 2023-07-31: qty 30

## 2023-07-31 MED ORDER — DEXAMETHASONE SODIUM PHOSPHATE 10 MG/ML IJ SOLN
INTRAMUSCULAR | Status: DC | PRN
  Administered 2023-07-31: 10 mg via INTRAVENOUS

## 2023-07-31 SURGICAL SUPPLY — 38 items
APL PRP STRL LF DISP 70% ISPRP (MISCELLANEOUS) ×2
APL SKNCLS STERI-STRIP NONHPOA (GAUZE/BANDAGES/DRESSINGS) ×1
BENZOIN TINCTURE PRP APPL 2/3 (GAUZE/BANDAGES/DRESSINGS) ×2 IMPLANT
CELLS DAT CNTRL 66122 CELL SVR (MISCELLANEOUS) ×1 IMPLANT
CHLORAPREP W/TINT 26 (MISCELLANEOUS) ×4 IMPLANT
CLAMP UMBILICAL CORD (MISCELLANEOUS) ×2 IMPLANT
CLOTH BEACON ORANGE TIMEOUT ST (SAFETY) ×2 IMPLANT
DRSG OPSITE POSTOP 4X10 (GAUZE/BANDAGES/DRESSINGS) ×2 IMPLANT
ELECT REM PT RETURN 9FT ADLT (ELECTROSURGICAL) ×1
ELECTRODE REM PT RTRN 9FT ADLT (ELECTROSURGICAL) ×2 IMPLANT
EXTRACTOR VACUUM M CUP 4 TUBE (SUCTIONS) IMPLANT
GAUZE SPONGE 4X4 12PLY STRL LF (GAUZE/BANDAGES/DRESSINGS) IMPLANT
GLOVE BIO SURGEON STRL SZ7.5 (GLOVE) ×2 IMPLANT
GLOVE BIOGEL PI IND STRL 7.0 (GLOVE) ×2 IMPLANT
GLOVE BIOGEL PI IND STRL 7.5 (GLOVE) ×2 IMPLANT
GOWN STRL REUS W/TWL LRG LVL3 (GOWN DISPOSABLE) ×4 IMPLANT
KIT ABG SYR 3ML LUER SLIP (SYRINGE) IMPLANT
NDL HYPO 25X5/8 SAFETYGLIDE (NEEDLE) IMPLANT
NEEDLE HYPO 25X5/8 SAFETYGLIDE (NEEDLE) IMPLANT
NS IRRIG 1000ML POUR BTL (IV SOLUTION) ×2 IMPLANT
PACK C SECTION WH (CUSTOM PROCEDURE TRAY) ×2 IMPLANT
PAD OB MATERNITY 4.3X12.25 (PERSONAL CARE ITEMS) ×2 IMPLANT
RETRACTOR WND ALEXIS 18 MED (MISCELLANEOUS) IMPLANT
RTRCTR C-SECT PINK 25CM LRG (MISCELLANEOUS) ×2 IMPLANT
RTRCTR WOUND ALEXIS 18CM MED (MISCELLANEOUS) ×1
STRIP CLOSURE SKIN 1/2X4 (GAUZE/BANDAGES/DRESSINGS) ×2 IMPLANT
SUT CHROMIC 2 0 CT 1 (SUTURE) ×2 IMPLANT
SUT MNCRL 0 VIOLET CTX 36 (SUTURE) ×2 IMPLANT
SUT MNCRL AB 3-0 PS2 27 (SUTURE) ×2 IMPLANT
SUT PLAIN 2 0 XLH (SUTURE) ×2 IMPLANT
SUT VIC AB 0 CT1 36 (SUTURE) ×2 IMPLANT
SUT VIC AB 0 CTX 36 (SUTURE) ×3
SUT VIC AB 0 CTX36XBRD ANBCTRL (SUTURE) ×6 IMPLANT
SUT VIC AB 2-0 SH 27 (SUTURE)
SUT VIC AB 2-0 SH 27XBRD (SUTURE) IMPLANT
TOWEL OR 17X24 6PK STRL BLUE (TOWEL DISPOSABLE) ×2 IMPLANT
TRAY FOLEY W/BAG SLVR 14FR LF (SET/KITS/TRAYS/PACK) ×2 IMPLANT
WATER STERILE IRR 1000ML POUR (IV SOLUTION) ×2 IMPLANT

## 2023-07-31 NOTE — Lactation Note (Signed)
This note was copied from a baby's chart. Lactation Consultation Note  Patient Name: Monique Keith [id number] Date: 07/31/2023 Age:26 hours  Reason for consult: Initial assessment;Term;1st time breastfeeding  P2, [redacted]w[redacted]d  Initial LC visit to see P2 mother who is breastfeeding for the first time. She reports baby just breast fed 20 minutes before my arrival. Baby was skin to skin on mother's chest. Mother receptive to be taught hand expression and colostrum was easily expressed. Discussed if baby is not latching, that she can hand express and spoon feed for calories and initiate feedings.   Mother encouraged to latch baby with feeding cues, place baby skin to skin if not latching, and call for assistance with breastfeeding as needed.   Mom made aware of O/P services, breastfeeding support groups, community resources, and our phone # for post-discharge questions.    Maternal Data Has patient been taught Hand Expression?: Yes Does the patient have breastfeeding experience prior to this delivery?: No (did not breastfeed her first child)  Feeding Mother's Current Feeding Choice: Breast Milk  LATCH Score Latch: Too sleepy or reluctant, no latch achieved, no sucking elicited.  Audible Swallowing: None  Type of Nipple: Everted at rest and after stimulation  Comfort (Breast/Nipple): Soft / non-tender  Hold (Positioning): Assistance needed to correctly position infant at breast and maintain latch.  LATCH Score: 5     Interventions Interventions: Education;LC Services brochure  Discharge Pump:  (request for Stork pump submitted)  Consult Status Consult Status: Follow-up Date: 08/01/23 Follow-up type: In-patient    Christella Hartigan M 07/31/2023, 5:49 PM

## 2023-07-31 NOTE — Op Note (Signed)
Cesarean Section Procedure Note  Indications: P1 at 39wks admitted for repeat c-section.  Pre-operative Diagnosis: 1.39wks 2.h/o Cesarean Section   Post-operative Diagnosis: 1.39wks 2.h/o Cesarean Section  Procedure: CESAREAN SECTION  Surgeon: Osborn Coho, MD    Assistants: Emeline General, MD  Anesthesia: Regional  Anesthesiologist: Collene Schlichter, MD   Procedure Details  The patient was taken to the operating room secondary to h/o c-section after the risks, benefits, complications, treatment options, and expected outcomes were discussed with the patient.  The patient concurred with the proposed plan, giving informed consent which was signed and witnessed. The patient was taken to Operating Room A, identified as Monique Keith and the procedure verified as C-Section Delivery. A Time Out was held and the above information confirmed.  After induction of anesthesia by obtaining a spinal, the patient was prepped and draped in the usual sterile manner. A Pfannenstiel skin incision was made and carried down through the subcutaneous tissue to the underlying layer of fascia.  The fascia was incised bilaterally and extended transversely bilaterally with the Mayo scissors. Kocher clamps were placed on the inferior aspect of the fascial incision and the underlying rectus muscle was separated from the fascia. The same was done on the superior aspect of the fascial incision.  The peritoneum was identified, entered bluntly and extended manually.  An Alexis self-retaining retractor was placed.  The utero-vesical peritoneal reflection was incised transversely and the bladder flap was bluntly freed from the lower uterine segment. A low transverse uterine incision was made with the scalpel and extended bilaterally with the bandage scissors.  The infant was delivered in vertex position without difficulty.  After the umbilical cord was clamped and cut, the infant was handed to the awaiting pediatricians.  Cord blood  was obtained for evaluation.  The placenta was removed intact and appeared to be within normal limits. The uterus was cleared of all clots and debris. The uterine incision was closed with running interlocking sutures of 0 Vicryl and a second imbricating layer was performed as well.   Bilateral tubes and ovaries appeared to be within normal limits.  Good hemostasis was noted.  Copious irrigation was performed until clear.  The peritoneum was repaired with 2-0 chromic via a running suture.  The muscle was reapproximated with 2 interrupted stitches of 0 vicryl.  The fascia was reapproximated with a running suture of 0 Vicryl.  The skin was reapproximated with a subcuticular suture of 3-0 monocryl.  Steristrips were applied.  Instrument, sponge, and needle counts were correct prior to abdominal closure and at the conclusion of the case.  The patient was awaiting transfer to the recovery room in good condition.  Findings: Live girl infant with Apgars 9 at one minute and 9 at five minutes.  Normal appearing bilateral ovaries and fallopian tubes were noted.  Estimated Blood Loss:  360 ml         Drains: foley to gravity 100 cc         Total IV Fluids: 1000 ml         Specimens to Pathology: none, placenta sent to L&D         Complications:  None; patient tolerated the procedure well.         Disposition: PACU - hemodynamically stable.         Condition: stable  Attending Attestation: I performed the procedure.  I was present and scrubbed and the assistant was required due to complexity of anatomy.

## 2023-07-31 NOTE — Transfer of Care (Signed)
Immediate Anesthesia Transfer of Care Note  Patient: Monique Keith  Procedure(s) Performed: CESAREAN SECTION  Patient Location: PACU  Anesthesia Type:Spinal  Level of Consciousness: awake, alert , and oriented  Airway & Oxygen Therapy: Patient Spontanous Breathing  Post-op Assessment: Report given to RN and Post -op Vital signs reviewed and stable  Post vital signs: Reviewed and stable  Last Vitals:  Vitals Value Taken Time  BP 116/61 07/31/23 1409  Temp 36.6 C 07/31/23 1410  Pulse 63 07/31/23 1411  Resp 16 07/31/23 1411  SpO2 99 % 07/31/23 1411  Vitals shown include unfiled device data.  Last Pain:  Vitals:   07/31/23 1410  TempSrc: Oral  PainSc:          Complications: No notable events documented.

## 2023-07-31 NOTE — Anesthesia Procedure Notes (Signed)
Spinal  Patient location during procedure: OR Start time: 07/31/2023 12:48 PM End time: 07/31/2023 12:53 PM Reason for block: surgical anesthesia Staffing Performed: anesthesiologist  Anesthesiologist: Collene Schlichter, MD Performed by: Collene Schlichter, MD Authorized by: Collene Schlichter, MD   Preanesthetic Checklist Completed: patient identified, IV checked, risks and benefits discussed, surgical consent, monitors and equipment checked, pre-op evaluation and timeout performed Spinal Block Patient position: sitting Prep: DuraPrep and site prepped and draped Patient monitoring: continuous pulse ox and blood pressure Approach: midline Location: L3-4 Injection technique: single-shot Needle Needle type: Pencan  Needle gauge: 24 G Assessment Events: CSF return and second provider Additional Notes Functioning IV was confirmed and monitors were applied. Sterile prep and drape, including hand hygiene, mask and sterile gloves were used. The patient was positioned and the spine was prepped. The skin was anesthetized with lidocaine.  Free flow of clear CSF was obtained prior to injecting local anesthetic into the CSF.  The spinal needle aspirated freely following injection.  The needle was carefully withdrawn.  The patient tolerated the procedure well. Consent was obtained prior to procedure with all questions answered and concerns addressed. Risks including but not limited to bleeding, infection, nerve damage, paralysis, failed block, inadequate analgesia, allergic reaction, high spinal, itching and headache were discussed and the patient wished to proceed.   Attempt x1 by SRNA, unable to obtain CSF. Attempt x1 by MDA with +CSF return.  Arrie Aran, MD

## 2023-07-31 NOTE — H&P (Signed)
Monique Keith is a 27 y.o. female presenting for repeat cesarean section.  No complaints.  Denies VB, LOF, ctxs and reports good FM.  OB History     Gravida  2   Para  1   Term  1   Preterm      AB      Living  1      SAB      IAB      Ectopic      Multiple      Live Births  1          Past Medical History:  Diagnosis Date   Anemia    Chlamydia    Gonorrhea    Headache    Hypertension    Hypoglycemia    Ovarian cyst    Vitamin D deficiency    Past Surgical History:  Procedure Laterality Date   CESAREAN SECTION N/A 06/03/2019   Procedure: CESAREAN SECTION;  Surgeon: Gerald Leitz, MD;  Location: [facility] LD ORS;  Service: Obstetrics;  Laterality: N/A;   HERNIA REPAIR     UMBILICAL HERNIA REPAIR     Family History: family history includes Asthma in her mother; Diabetes in her maternal grandfather; Heart disease in her father; Hyperlipidemia in her maternal grandfather; Hypertension in her maternal grandfather and maternal grandmother. Social History:  reports that she quit smoking about 4 years ago. Her smoking use included cigarettes. She has never used smokeless tobacco. She reports that she does not currently use drugs after having used the following drugs: Marijuana. She reports that she does not drink alcohol.     Maternal Diabetes: No Genetic Screening: Normal Maternal Ultrasounds/Referrals: Normal Fetal Ultrasounds or other Referrals:  None Maternal Substance Abuse:  No Significant Maternal Medications:  None Significant Maternal Lab Results:  Group B Strep negative Number of Prenatal Visits:greater than 3 verified prenatal visits Maternal Vaccinations:TDap (2020) Other Comments:   Concerns of SGA early in the pregnancy but resolved.  Marginal cord insertion noted on anatomy scan.  Review of Systems Denies F/C/N/V/D  History   Blood pressure (!) 132/96, pulse 81, temperature 98.1 F (36.7 C), temperature source Oral, height 5\' 4"  (1.626 m), weight  81.2 kg, last menstrual period 10/31/2022, SpO2 99%. Exam Physical Exam  Lungs unlabored CV RRR Abdomen gravid, NT Extremities no calf tenderness  Prenatal labs: ABO, Rh: --/--/A POS (11/11 1033) Antibody: NEG (11/11 1033) Rubella: Immune (04/22 0000) RPR: NON REACTIVE (11/11 1025)  HBsAg: Negative (04/22 0000)  HIV: Non-reactive (04/22 0000)  GBS:   Negative 07-12-23  FHR 153 EFW 5lbs 5oz 43%, posterior placenta 06-26-23  Assessment/Plan: 27yo G2P1001 at 39wks presenting for scheduled repeat c-section.  Risks benefits and alternatives reviewed with the patient including but not limited to bleeding infection and injury.  Questions answered and consent signed and witnessed.     Purcell Nails 07/31/2023, 12:06 PM

## 2023-07-31 NOTE — Anesthesia Postprocedure Evaluation (Signed)
Anesthesia Post Note  Patient: Monique Keith  Procedure(s) Performed: CESAREAN SECTION     Patient location during evaluation: PACU Anesthesia Type: Spinal Level of consciousness: awake, awake and alert and oriented Pain management: pain level controlled Vital Signs Assessment: post-procedure vital signs reviewed and stable Respiratory status: spontaneous breathing, nonlabored ventilation and respiratory function stable Cardiovascular status: blood pressure returned to baseline and stable Postop Assessment: no headache, no backache, spinal receding and no apparent nausea or vomiting Anesthetic complications: no   No notable events documented.  Last Vitals:    Last Pain:                 Collene Schlichter

## 2023-07-31 NOTE — Anesthesia Preprocedure Evaluation (Signed)
Anesthesia Evaluation  Patient identified by MRN, date of birth, ID band Patient awake    Reviewed: Allergy & Precautions, NPO status , Patient's Chart, lab work & pertinent test results  Airway Mallampati: II  TM Distance: >3 FB Neck ROM: Full    Dental  (+) Teeth Intact, Dental Advisory Given   Pulmonary former smoker   Pulmonary exam normal breath sounds clear to auscultation       Cardiovascular negative cardio ROS Normal cardiovascular exam Rhythm:Regular Rate:Normal     Neuro/Psych  Headaches  negative psych ROS   GI/Hepatic negative GI ROS, Neg liver ROS,,,  Endo/Other  Obesity   Renal/GU negative Renal ROS     Musculoskeletal negative musculoskeletal ROS (+)    Abdominal   Peds  Hematology negative hematology ROS (+)   Anesthesia Other Findings Day of surgery medications reviewed with the patient.  Reproductive/Obstetrics (+) Pregnancy H/o C-section                              Anesthesia Physical Anesthesia Plan  ASA: 2  Anesthesia Plan: Spinal   Post-op Pain Management:    Induction: Intravenous  PONV Risk Score and Plan: 2 and Scopolamine patch - Pre-op, Dexamethasone and Ondansetron  Airway Management Planned: Natural Airway  Additional Equipment:   Intra-op Plan:   Post-operative Plan:   Informed Consent: I have reviewed the patients History and Physical, chart, labs and discussed the procedure including the risks, benefits and alternatives for the proposed anesthesia with the patient or authorized representative who has indicated his/her understanding and acceptance.     Dental advisory given  Plan Discussed with: CRNA, Anesthesiologist and Surgeon  Anesthesia Plan Comments: (Discussed risks and benefits of and differences between spinal and general. Discussed risks of spinal including headache, backache, failure, bleeding, infection, and nerve damage.  Patient consents to spinal. Questions answered. Coagulation studies and platelet count acceptable.)       Anesthesia Quick Evaluation

## 2023-08-01 ENCOUNTER — Encounter (HOSPITAL_COMMUNITY): Payer: Self-pay | Admitting: Obstetrics and Gynecology

## 2023-08-01 LAB — CBC
HCT: 30.3 % — ABNORMAL LOW (ref 36.0–46.0)
Hemoglobin: 9.9 g/dL — ABNORMAL LOW (ref 12.0–15.0)
MCH: 26 pg (ref 26.0–34.0)
MCHC: 32.7 g/dL (ref 30.0–36.0)
MCV: 79.5 fL — ABNORMAL LOW (ref 80.0–100.0)
Platelets: 288 10*3/uL (ref 150–400)
RBC: 3.81 MIL/uL — ABNORMAL LOW (ref 3.87–5.11)
RDW: 14.4 % (ref 11.5–15.5)
WBC: 16.5 10*3/uL — ABNORMAL HIGH (ref 4.0–10.5)
nRBC: 0 % (ref 0.0–0.2)

## 2023-08-01 LAB — BIRTH TISSUE RECOVERY COLLECTION (PLACENTA DONATION)

## 2023-08-01 MED ORDER — POLYSACCHARIDE IRON COMPLEX 150 MG PO CAPS
150.0000 mg | ORAL_CAPSULE | ORAL | Status: DC
Start: 2023-08-01 — End: 2023-08-02
  Administered 2023-08-01: 150 mg via ORAL
  Filled 2023-08-01: qty 1

## 2023-08-01 NOTE — Lactation Note (Signed)
This note was copied from a baby's chart. Lactation Consultation Note  Patient Name: Monique Keith [id number] Date: 08/01/2023 Age:26 hours Reason for consult: Follow-up assessment;Term;RN request  P2- RN requested for [facility] to see MOB due to infant not eating well. MOB reports that infant has been nursing well on the breast compared to the bottle of formula. RN had just formula fed infant 10 mL with an Nfant white nipple, so LC could not access a latch at this time. LC encouraged MOB to call for infant's next feeding so LC could assess her latch. MOB verbalized understanding and agreement.  LC reviewed feeding infant on cue 8-12x in 24 hrs, not allowing infant to go over 3 hrs without a feeding, CDC milk storage guidelines, LC services handout and breast care. LC encouraged MOB to call for further support. LC provided MOB with 18 mm flanges for her Stork DEBP.  Feeding Mother's Current Feeding Choice: Breast Milk and Formula  Lactation Tools Discussed/Used Flange Size: 18 Pump Education: Milk Storage  Interventions Interventions: Breast feeding basics reviewed;Pace feeding;Education;LC Services brochure  Discharge Discharge Education: Warning signs for feeding baby Pump: DEBP;Stork Pump  Consult Status Consult Status: Follow-up Date: 08/02/23 Follow-up type: In-patient    Dema Severin BS, IBCLC 08/01/2023, 2:20 PM

## 2023-08-01 NOTE — Progress Notes (Signed)
Subjective: Postpartum Day 1: Cesarean Delivery Patient reports tolerating PO and no problems voiding.    Objective: Vital signs in last 24 hours: Temp:  [97.5 F (36.4 C)-98.8 F (37.1 C)] 98.8 F (37.1 C) (11/13 0645) Pulse Rate:  [60-89] 61 (11/13 0645) Resp:  [15-20] 20 (11/13 0645) BP: (99-132)/(54-96) 107/57 (11/13 0645) SpO2:  [94 %-100 %] 99 % (11/13 0645) Weight:  [81.2 kg] 81.2 kg (11/12 1112)  Physical Exam:  General: alert, cooperative, and no distress. Lochia: appropriate. Uterine Fundus: firm. Incision: With honey comb dressing clean, dry and intact.  DVT Evaluation: No evidence of DVT seen on physical exam. No significant calf/ankle edema.  Recent Labs    07/30/23 1025 08/01/23 0533  HGB 10.2* 9.9*  HCT 31.8* 30.3*    Assessment/Plan: 27 y/o G2P2002 POD # 1 s/p Repeat Cesarean section, Doing well postoperatively.  Continue current care. Iron tablets for anemia.   Prescilla Sours, MD 08/01/2023, 11:05 AM

## 2023-08-02 DIAGNOSIS — D62 Acute posthemorrhagic anemia: Secondary | ICD-10-CM | POA: Diagnosis not present

## 2023-08-02 MED ORDER — IBUPROFEN 600 MG PO TABS: 600.0000 mg | ORAL_TABLET | Freq: Four times a day (QID) | ORAL | 0 refills | Status: AC

## 2023-08-02 MED ORDER — OXYCODONE HCL 5 MG PO TABS: 5.0000 mg | ORAL_TABLET | ORAL | 0 refills | Status: DC | PRN

## 2023-08-02 MED ORDER — FENTANYL CITRATE (PF) 100 MCG/2ML IJ SOLN: 50.0000 ug | INTRAMUSCULAR | Status: DC | PRN

## 2023-08-02 MED ORDER — MEDROXYPROGESTERONE ACETATE 150 MG/ML IM SUSP
150.0000 mg | Freq: Once | INTRAMUSCULAR | Status: AC
  Administered 2023-08-02: 150 mg via INTRAMUSCULAR
  Filled 2023-08-02: qty 1

## 2023-08-02 NOTE — Discharge Summary (Signed)
RCS OB Discharge Summary       Patient Name: Monique Keith DOB: 11/22/1995 MRN: [id number]  Date of admission: 07/31/2023 Delivering MD: Osborn Coho Date of delivery: 07/31/2023 Type of delivery: RCS  Newborn Data: Sex: Baby female Live born female  Birth Weight: 7 lb 1.2 oz (3210 g) APGAR: 9, 9  Newborn Delivery   Birth date/time: 07/31/2023 13:18:32 Delivery type: C-Section, Low Transverse Trial of labor: No C-section categorization: Repeat     Feeding: breast and bottle Infant being discharge to home with mother in stable condition.   Admitting diagnosis: History of cesarean section [Z98.891] Status post repeat low transverse cesarean section [Z98.891] Intrauterine pregnancy: [redacted]w[redacted]d     Secondary diagnosis:  Principal Problem:   History of cesarean section Active Problems:   Normal postpartum course   Status post repeat low transverse cesarean section   Acute postoperative anemia due to expected blood loss                                Complications: None                                                              Intrapartum Procedures: cesarean: low cervical, transverse Postpartum Procedures: none Complications-Operative and Postpartum: none Augmentation: AROM and at time of delivery    History of Present Illness: [name] is a 27 y.o. female, G2P2002, who presents at [redacted]w[redacted]d weeks gestation. The patient has been followed at  [facility]  Her pregnancy has been complicated by:  Patient Active Problem List   Diagnosis Date Noted   Acute postoperative anemia due to expected blood loss 08/02/2023   History of cesarean section 07/31/2023   Status post repeat low transverse cesarean section 07/31/2023   Marginal insertion of umbilical cord affecting management of mother 06/18/2023   History of hypertension 03/07/2023   Cesarean delivery delivered - arrest of dilation 5 cm 06/03/2019   Normal postpartum course  06/03/2019   History of prior pregnancy with IUGR newborn 06/01/2019     Active Ambulatory Problems    Diagnosis Date Noted   History of prior pregnancy with IUGR newborn 06/01/2019   Cesarean delivery delivered - arrest of dilation 5 cm 06/03/2019   Normal postpartum course 06/03/2019   History of hypertension 03/07/2023   Marginal insertion of umbilical cord affecting management of mother 06/18/2023   Resolved Ambulatory Problems    Diagnosis Date Noted   Encounter for induction of labor 06/01/2019   Past Medical History:  Diagnosis Date   Anemia    Chlamydia    Gonorrhea    Headache    Hypertension    Hypoglycemia    Ovarian cyst    Vitamin D deficiency      Hospital course:  Sceduled C/S   27 y.o. yo G2P2002 at [redacted]w[redacted]d was admitted to the hospital 07/31/2023 for scheduled cesarean section with the following indication:Elective Repeat.Delivery details are as follows:  Membrane Rupture Time/Date: 1:17 PM,07/31/2023  Delivery Method:C-Section, Low Transverse Operative Delivery:N/A Details of operation can be found in separate operative note.  Patient had a postpartum course complicated byPt was admitted with dr Su Hilt on 11/12 for RCS, under spinal, qbl was ,  hgb drop of 10.2-9.9 expected blood loss, asymptomatic,  Infant was in good condition and remained at the patient's bedside.  The patient was taken to recovery in good condition.  h/o HTN when she was on OBC but not medicated and did not require medication during her stay.  BP normotensive.  She is ambulating, tolerating a regular diet, passing flatus, and urinating well. Patient is discharged home in stable condition on  08/02/23        Newborn Data: Birth date:07/31/2023 Birth time:1:18 PM Gender:Female Living status:Living Apgars:9 ,9  Weight:3210 g   Postpartum Day # 2 :  Patient up ad lib, denies syncope or dizziness. Reports consuming regular diet without issues and denies N/V. Patient reports 0 bowel  movement + passing flatus.  Denies issues with urination and reports bleeding is "lighter."  Patient is breast and bottle feeding and reports going well.  Desires depo prior to discharge for postpartum contraception.  Pain is being appropriately managed with use of po meds. Throughout her stay, her physical exam was WNL, her incision was CDI, and her vital signs remained stable.  By post-op day 1, she was up ad lib, tolerating a regular diet, with good pain control with po med.  She was deemed to have received the full benefit of her hospital stay, and was discharged home in stable condition.  Contraceptive choice was depo.  Pt meets crieria for early discharge  Physical exam  Vitals:   08/01/23 0645 08/01/23 1235 08/01/23 2247 08/02/23 0530  BP: (!) 107/57 108/63 113/63 (!) 112/58  Pulse: 61 62 (!) 58 68  Resp: 20 18 18 18   Temp: 98.8 F (37.1 C) 98.5 F (36.9 C) 98.5 F (36.9 C) 97.7 F (36.5 C)  TempSrc: Oral Oral Oral Oral  SpO2: 99% 100%  99%  Weight:      Height:       General: alert, cooperative, and no distress Lochia: appropriate Uterine Fundus: firm Incision: Healing well with no significant drainage, No significant erythema, Dressing is clean, dry, and intact, honeycomb dressing CDI Perineum: intact DVT Evaluation: No evidence of DVT seen on physical exam. Negative Homan's sign. No cords or calf tenderness. No significant calf/ankle edema.  Labs: Lab Results  Component Value Date   WBC 16.5 (H) 08/01/2023   HGB 9.9 (L) 08/01/2023   HCT 30.3 (L) 08/01/2023   MCV 79.5 (L) 08/01/2023   PLT 288 08/01/2023      Latest Ref Rng & Units 10/28/2022    1:40 PM  CMP  Glucose 70 - 99 mg/dL 78   BUN 6 - 20 mg/dL 13   Creatinine 0.98 - 1.00 mg/dL 1.19   Sodium 147 - 829 mmol/L 139   Potassium 3.5 - 5.1 mmol/L 3.7   Chloride 98 - 111 mmol/L 107   CO2 22 - 32 mmol/L 20   Calcium 8.9 - 10.3 mg/dL 9.2   Total Protein 6.5 - 8.1 g/dL 7.6   Total Bilirubin 0.3 - 1.2 mg/dL 0.8    Alkaline Phos 38 - 126 U/L 54   AST 15 - 41 U/L 12   ALT 0 - 44 U/L 8     Date of discharge: 08/02/2023 Discharge Diagnoses: Term Pregnancy-delivered Discharge instruction: per After Visit Summary and "Baby and Me Booklet".  After visit meds:   Activity:           unrestricted and pelvic rest Advance as tolerated. Pelvic rest for 6 weeks.  Diet:  routine Medications: PNV, Ibuprofen, Colace, Iron, and oxy IR Postpartum contraception: Depo Provera Condition:  Pt discharge to home with baby in stable   Meds: Allergies as of 08/02/2023   No Known Allergies      Medication List     STOP taking these medications    ondansetron 4 MG disintegrating tablet Commonly known as: ZOFRAN-ODT       TAKE these medications    acetaminophen 500 MG tablet Commonly known as: TYLENOL Take 1 tablet (500 mg total) by mouth every 6 (six) hours as needed.   cyclobenzaprine 5 MG tablet Commonly known as: FLEXERIL Take 2 tablets (10 mg total) by mouth 2 (two) times daily as needed for muscle spasms.   ibuprofen 600 MG tablet Commonly known as: ADVIL Take 1 tablet (600 mg total) by mouth every 6 (six) hours.   oxyCODONE 5 MG immediate release tablet Commonly known as: Oxy IR/ROXICODONE Take 1 tablet (5 mg total) by mouth every 4 (four) hours as needed for moderate pain (pain score 4-6).   Vitamin D 50 MCG (2000 UT) tablet Take 2,000 Units by mouth daily.               Discharge Care Instructions  (From admission, onward)           Start     Ordered   08/02/23 0000  Discharge wound care:       Comments: Take dressing off on day 5-7 postpartum.  Report increased drainage, redness or warmth. Clean with water, let soap trickle down body. Can leave steri strips on until they fall off or take them off gently at day 10. Keep open to air, clean and dry.   08/02/23 0913            Discharge Follow Up:   Follow-up Information     [facility]  Obstetrics & Gynecology. Schedule an appointment as soon as possible for a visit in 1 week(s).   Specialty: Obstetrics and Gynecology Why: 1 weeks Bp check and 6 week PPV Contact information: [address] [place] [address] [phone]                 [facility] CNM, FNP-C, PMHNP-BC  [address]  [place], [place] [address]  Cell: [phone]  Office Phone: [phone] Fax: [phone] 08/02/2023  9:14 AM

## 2023-08-02 NOTE — Progress Notes (Addendum)
Just got off phone from calling on call [facility] [place]).  Explained that for last 2-3 hours patient says her pain has increased.  Is a combination of incision pain (burning) and intense cramps/contractions.  Patient has had maximum dose/all available pain meds that could be given, with pain still ;9 out of 10.  Per [place], give fentanyl IV q2hrs prn.    Went into patient room, told her the new pain medication orders.  Also offered a ice pack to put on her incision to see if that would help.  The patient declined both, said she will call out if it does not decrease, she will call out for the fentanyl prn dose.

## 2023-08-11 ENCOUNTER — Telehealth (HOSPITAL_COMMUNITY): Payer: Self-pay

## 2023-08-11 NOTE — Telephone Encounter (Signed)
08/11/2023 1402  Name: Monique Keith MRN: [id number] DOB: 10-May-1996  Reason for Call:  Transition of Care Hospital Discharge Call  Contact Status: Patient Contact Status: Complete  Language assistant needed:          Follow-Up Questions: Do You Have Any Concerns About Your Health As You Heal From Delivery?: Yes What Concerns Do You Have About Your Health?: Patient states that she has been having pain at her incision. She states that she has been taking motrin only for pain. She states that she does not like the way the oxycodone makes her feel. RN reviewed pain medications on D/C summary with patient. RN told patient to contact her OB-GYN if pain is not well controlled or if she is having increased pain. Patient declines any other questions or concerns at this time. Do You Have Any Concerns About Your Infants Health?: No  Edinburgh Postnatal Depression Scale:  In the Past 7 Days: I have been able to laugh and see the funny side of things.: As much as I always could I have looked forward with enjoyment to things.: As much as I ever did I have blamed myself unnecessarily when things went wrong.: No, never I have been anxious or worried for no good reason.: No, not at all I have felt scared or panicky for no good reason.: No, not at all Things have been getting on top of me.: No, I have been coping as well as ever I have been so unhappy that I have had difficulty sleeping.: Not at all I have felt sad or miserable.: No, not at all I have been so unhappy that I have been crying.: No, never The thought of harming myself has occurred to me.: Never Inocente Salles Postnatal Depression Scale Total: 0  PHQ2-9 Depression Scale:     Discharge Follow-up: Edinburgh score requires follow up?: No Patient was advised of the following resources:: Support Group, Breastfeeding Support Group  Post-discharge interventions: Reviewed Newborn Safe Sleep Practices  Signature  Signe Colt

## 2023-09-25 DIAGNOSIS — Z3040 Encounter for surveillance of contraceptives, unspecified: Secondary | ICD-10-CM | POA: Diagnosis not present

## 2023-11-01 DIAGNOSIS — Z3042 Encounter for surveillance of injectable contraceptive: Secondary | ICD-10-CM | POA: Diagnosis not present

## 2024-03-18 DIAGNOSIS — Z0001 Encounter for general adult medical examination with abnormal findings: Secondary | ICD-10-CM | POA: Diagnosis not present

## 2024-03-18 DIAGNOSIS — Z113 Encounter for screening for infections with a predominantly sexual mode of transmission: Secondary | ICD-10-CM | POA: Diagnosis not present

## 2024-03-18 DIAGNOSIS — Z3201 Encounter for pregnancy test, result positive: Secondary | ICD-10-CM | POA: Diagnosis not present

## 2024-03-18 DIAGNOSIS — Z3042 Encounter for surveillance of injectable contraceptive: Secondary | ICD-10-CM | POA: Diagnosis not present

## 2024-03-18 DIAGNOSIS — Z124 Encounter for screening for malignant neoplasm of cervix: Secondary | ICD-10-CM | POA: Diagnosis not present

## 2024-03-18 DIAGNOSIS — E559 Vitamin D deficiency, unspecified: Secondary | ICD-10-CM | POA: Diagnosis not present

## 2024-04-26 DIAGNOSIS — M654 Radial styloid tenosynovitis [de Quervain]: Secondary | ICD-10-CM | POA: Diagnosis not present

## 2024-05-07 ENCOUNTER — Emergency Department (HOSPITAL_BASED_OUTPATIENT_CLINIC_OR_DEPARTMENT_OTHER)

## 2024-05-07 ENCOUNTER — Encounter (HOSPITAL_BASED_OUTPATIENT_CLINIC_OR_DEPARTMENT_OTHER): Payer: Self-pay

## 2024-05-07 ENCOUNTER — Other Ambulatory Visit: Payer: Self-pay

## 2024-05-07 ENCOUNTER — Emergency Department (HOSPITAL_BASED_OUTPATIENT_CLINIC_OR_DEPARTMENT_OTHER)
Admission: EM | Admit: 2024-05-07 | Discharge: 2024-05-07 | Disposition: A | Discharge status: Home / Self Care | Attending: Emergency Medicine | Admitting: Emergency Medicine

## 2024-05-07 DIAGNOSIS — S93491A Sprain of other ligament of right ankle, initial encounter: Secondary | ICD-10-CM | POA: Diagnosis not present

## 2024-05-07 DIAGNOSIS — S99911A Unspecified injury of right ankle, initial encounter: Secondary | ICD-10-CM | POA: Diagnosis present

## 2024-05-07 DIAGNOSIS — S93401A Sprain of unspecified ligament of right ankle, initial encounter: Secondary | ICD-10-CM | POA: Insufficient documentation

## 2024-05-07 DIAGNOSIS — M79671 Pain in right foot: Secondary | ICD-10-CM | POA: Diagnosis not present

## 2024-05-07 DIAGNOSIS — W109XXA Fall (on) (from) unspecified stairs and steps, initial encounter: Secondary | ICD-10-CM | POA: Diagnosis not present

## 2024-05-07 MED ORDER — HYDROCODONE-ACETAMINOPHEN 5-325 MG PO TABS
1.0000 | ORAL_TABLET | Freq: Once | ORAL | Status: AC
  Administered 2024-05-07: 1 via ORAL
  Filled 2024-05-07: qty 1

## 2024-05-07 MED ORDER — NAPROXEN 500 MG PO TABS: 500.0000 mg | ORAL_TABLET | Freq: Two times a day (BID) | ORAL | 0 refills | Status: DC

## 2024-05-07 NOTE — ED Provider Notes (Signed)
 [place] EMERGENCY DEPARTMENT AT [facility] Provider Note   CSN: [id number] Arrival date & time: 05/07/24  1533     Patient presents with: Monique Keith is a 28 y.o. female.   Patient is a 28 year old female who presents to the emergency department the chief complaint of pain to the right foot and ankle following a fall down approximately 3 stairs.  She notes she did not injure herself in any other locations.  She denies any long bone or joint pain.  She did not strike her head or injure her neck or back.  She denies any associated numbness or paresthesias.   Fall       Prior to Admission medications   Medication Sig Start Date End Date Taking? Authorizing Provider  acetaminophen  (TYLENOL ) 500 MG tablet Take 1 tablet (500 mg total) by mouth every 6 (six) hours as needed. Patient not taking: Reported on 07/24/2023 07/03/23   Eldonna Suzen Octave, MD  Cholecalciferol (VITAMIN D) 50 MCG (2000 UT) tablet Take 2,000 Units by mouth daily.    [provider]  cyclobenzaprine  (FLEXERIL ) 5 MG tablet Take 2 tablets (10 mg total) by mouth 2 (two) times daily as needed for muscle spasms. Patient not taking: Reported on 07/24/2023 07/03/23   Eldonna Suzen Octave, MD  ibuprofen  (ADVIL ) 600 MG tablet Take 1 tablet (600 mg total) by mouth every 6 (six) hours. 08/02/23   [place] , Jade, FNP  oxyCODONE  (OXY IR/ROXICODONE ) 5 MG immediate release tablet Take 1 tablet (5 mg total) by mouth every 4 (four) hours as needed for moderate pain (pain score 4-6). 08/02/23   [place] , Jade, FNP    Allergies: Patient has no known allergies.    Review of Systems  Musculoskeletal:        Pain to right foot and ankle  All other systems reviewed and are negative.   Updated Vital Signs BP (!) 146/99 (BP Location: Right Arm)   Pulse 88   Temp 98.7 F (37.1 C) (Oral)   Resp 18   SpO2 99%   Physical Exam Vitals and nursing note reviewed.  Constitutional:      Appearance:  Normal appearance.  HENT:     Head: Normocephalic and atraumatic.  Eyes:     Extraocular Movements: Extraocular movements intact.     Conjunctiva/sclera: Conjunctivae normal.     Pupils: Pupils are equal, round, and reactive to light.  Cardiovascular:     Rate and Rhythm: Normal rate and regular rhythm.     Pulses: Normal pulses.     Heart sounds: Normal heart sounds.  Pulmonary:     Effort: Pulmonary effort is normal.  Musculoskeletal:        General: Normal range of motion.     Cervical back: Normal range of motion and neck supple. No rigidity or tenderness.     Comments: Tenderness palpation noted over the lateral aspect of the right ankle and foot, nontender palpation of the right knee or proximal fibula, nontender palpation over hip, nontender palpation of the left lower extremity diffusely, DP and PT pulse are 2+ distally, sensation intact distally, limited active and passive range of motion of right ankle secondary to pain, edema noted to the right ankle, no obvious deformity or bruising, skin breakdown or ulceration, lacerations or abrasions  Skin:    General: Skin is warm and dry.  Neurological:     General: No focal deficit present.     Mental Status: She is alert and oriented to  person, place, and time. Mental status is at baseline.  Psychiatric:        Mood and Affect: Mood normal.        Behavior: Behavior normal.        Thought Content: Thought content normal.        Judgment: Judgment normal.     (all labs ordered are listed, but only abnormal results are displayed) Labs Reviewed - No data to display  EKG: None  Radiology: No results found.   Procedures   Medications Ordered in the ED  HYDROcodone -acetaminophen  (NORCO/VICODIN) 5-325 MG per tablet 1 tablet (has no administration in time range)                                    Medical Decision Making Patient is doing well at this time and is stable for discharge home.  Discussed with patient that  x-rays demonstrated no signs of acute osseous injury or lesions.  Suspect ankle sprain at this time.  She was nontender palpation over the proximal fibula.  She had no other long bone or joint pain noted on physical exam.  She did not strike her head during the fall and has no associated pain to neck or back.  She has no pain to chest wall or abdomen.  Do not suspect any further advanced imaging is warranted.  Discussed with patient we will provide information for follow-up with orthopedics for any continued symptoms.  Will apply cam boot and crutches.  Strict turn precautions were discussed for any new or worsening symptoms.  Patient voiced understanding and had no additional questions.  Patient was neurovascularly intact distally.  Amount and/or Complexity of Data Reviewed Radiology: ordered.  Risk Prescription drug management.        Final diagnoses:  None    ED Discharge Orders     None          Daralene Lonni JONETTA DEVONNA 05/07/24 1737    Yolande Lamar BROCKS, MD 05/09/24 [phone]

## 2024-05-07 NOTE — ED Triage Notes (Signed)
 C/o R ankle pain after falling down 3 steps early today. Denies hitting head.

## 2024-05-07 NOTE — Discharge Instructions (Signed)
 Please follow-up closely with orthopedics on an outpatient basis for any continued symptoms.  Return to emergency department immediately for any new or worsening symptoms.

## 2024-05-23 DIAGNOSIS — R0982 Postnasal drip: Secondary | ICD-10-CM | POA: Diagnosis not present

## 2024-05-23 DIAGNOSIS — J069 Acute upper respiratory infection, unspecified: Secondary | ICD-10-CM | POA: Diagnosis not present

## 2024-09-09 ENCOUNTER — Encounter: Payer: Self-pay | Admitting: Family Medicine

## 2024-09-09 ENCOUNTER — Ambulatory Visit: Admitting: Family Medicine

## 2024-09-09 VITALS — BP 122/84 | HR 76 | Ht 64.0 in | Wt 204.0 lb

## 2024-09-09 DIAGNOSIS — G43009 Migraine without aura, not intractable, without status migrainosus: Secondary | ICD-10-CM

## 2024-09-09 DIAGNOSIS — N926 Irregular menstruation, unspecified: Secondary | ICD-10-CM

## 2024-09-09 DIAGNOSIS — F900 Attention-deficit hyperactivity disorder, predominantly inattentive type: Secondary | ICD-10-CM

## 2024-09-09 DIAGNOSIS — Z862 Personal history of diseases of the blood and blood-forming organs and certain disorders involving the immune mechanism: Secondary | ICD-10-CM

## 2024-09-09 DIAGNOSIS — Z30013 Encounter for initial prescription of injectable contraceptive: Secondary | ICD-10-CM

## 2024-09-09 DIAGNOSIS — N912 Amenorrhea, unspecified: Secondary | ICD-10-CM

## 2024-09-09 DIAGNOSIS — R0683 Snoring: Secondary | ICD-10-CM

## 2024-09-09 DIAGNOSIS — R635 Abnormal weight gain: Secondary | ICD-10-CM

## 2024-09-09 DIAGNOSIS — Z79899 Other long term (current) drug therapy: Secondary | ICD-10-CM

## 2024-09-09 DIAGNOSIS — E66812 Obesity, class 2: Secondary | ICD-10-CM | POA: Diagnosis not present

## 2024-09-09 DIAGNOSIS — Z6835 Body mass index (BMI) 35.0-35.9, adult: Secondary | ICD-10-CM | POA: Diagnosis not present

## 2024-09-09 DIAGNOSIS — E559 Vitamin D deficiency, unspecified: Secondary | ICD-10-CM | POA: Diagnosis not present

## 2024-09-09 DIAGNOSIS — G43E09 Chronic migraine with aura, not intractable, without status migrainosus: Secondary | ICD-10-CM

## 2024-09-09 LAB — POCT URINE PREGNANCY: Preg Test, Ur: NEGATIVE

## 2024-09-09 MED ORDER — MEDROXYPROGESTERONE ACETATE 150 MG/ML IM SUSP
150.0000 mg | Freq: Once | INTRAMUSCULAR | Status: AC
  Administered 2024-09-09: 150 mg via INTRAMUSCULAR

## 2024-09-09 NOTE — Progress Notes (Signed)
 " PHENTERMINE/TOPIRAMATE LMP: BAD CRAMPS.     Patient Care Team: Prentiss Frieze, DO as PCP - General (Family Medicine) Hands, Minta, NP as PCP - OBGYN (Obstetrics and Gynecology)  Weight Management   Starting: Today's weight: 204 lbs Today's date: 09/16/2024 Body mass index is 35.02 kg/m.   Subjective   History of Present Illness Monique Keith is a 28 year old female who presents with headaches, dizziness, and skin changes.  Headache and associated symptoms - Severe headaches occur approximately three times per week - Headaches are accompanied by nausea and photophobia - Symptoms require her to rest during episodes - Uses Excedrin for relief - No prior medical evaluation for headaches  Dizziness and fatigue - Intermittent dizziness without clear triggers - Chronic fatigue present - History of low iron , not currently taking iron  supplements  Cutaneous hypopigmentation - Progressively enlarging hypopigmented area on the left hand - No associated itching, pain, trauma, or similar lesions elsewhere  Sleep disturbance and snoring - Chronic poor sleep since high school - Snoring present - Dry throat and occasional throat discomfort  Hypertension history - History of high blood pressure as a teenager - Previously treated with verapamil , discontinued on her own  Attention deficit hyperactivity disorder (adhd) and medication intolerance - History of ADHD - Previously treated with Vyvanse, discontinued due to excessive sleepiness and headaches  Lifestyle factors - Stay-at-home mother - Does not exercise regularly - Not taking any medications or supplements, including birth control  Review of Systems: Negative, with the exception of above mentioned in HPI.  History   Reviewed by clinician on day of visit: allergies, medications, problem list, medical history, surgical history, family history, social history, and previous encounter notes.  Medications   Show/hide  medication list[1] Allergies[2]  Objective   BP 122/84 (BP Location: Right Arm, Cuff Size: Large)   Pulse 76   Ht 5' 4 (1.626 m)   Wt 204 lb (92.5 kg)   LMP 09/09/2024 (Approximate)   SpO2 97%   Breastfeeding No   BMI 35.02 kg/m    Physical Exam Constitutional:      General: She is not in acute distress.    Appearance: She is well-developed.  HENT:     Head: Normocephalic and atraumatic.  Eyes:     Conjunctiva/sclera: Conjunctivae normal.  Cardiovascular:     Rate and Rhythm: Normal rate and regular rhythm.     Heart sounds: Normal heart sounds.  Pulmonary:     Effort: Pulmonary effort is normal.     Breath sounds: Normal breath sounds.  Neurological:     General: No focal deficit present.     Mental Status: She is alert.  Psychiatric:        Behavior: Behavior normal.       Results for orders placed or performed in visit on 09/09/24  POCT urine pregnancy   Collection Time: 09/09/24 12:48 PM  Result Value Ref Range   Preg Test, Ur Negative Negative  CBC with Differential/Platelet   Collection Time: 09/15/24 10:11 AM  Result Value Ref Range   WBC 7.2 4.0 - 10.5 K/uL   RBC 4.99 3.87 - 5.11 Mil/uL   Hemoglobin 13.0 12.0 - 15.0 g/dL   HCT 59.8 63.9 - 53.9 %   MCV 80.4 78.0 - 100.0 fl   MCHC 32.5 30.0 - 36.0 g/dL   RDW 84.6 88.4 - 84.4 %   Platelets 371.0 150.0 - 400.0 K/uL   Neutrophils Relative % 58.6 43.0 - 77.0 %  Lymphocytes Relative 31.6 12.0 - 46.0 %   Monocytes Relative 7.1 3.0 - 12.0 %   Eosinophils Relative 2.2 0.0 - 5.0 %   Basophils Relative 0.5 0.0 - 3.0 %   Neutro Abs 4.2 1.4 - 7.7 K/uL   Lymphs Abs 2.3 0.7 - 4.0 K/uL   Monocytes Absolute 0.5 0.1 - 1.0 K/uL   Eosinophils Absolute 0.2 0.0 - 0.7 K/uL   Basophils Absolute 0.0 0.0 - 0.1 K/uL  Comprehensive metabolic panel with GFR   Collection Time: 09/15/24 10:11 AM  Result Value Ref Range   Sodium 139 135 - 145 mEq/L   Potassium 3.9 3.5 - 5.1 mEq/L   Chloride 104 96 - 112 mEq/L   CO2 26  19 - 32 mEq/L   Glucose, Bld 86 70 - 99 mg/dL   BUN 10 6 - 23 mg/dL   Creatinine, Ser 9.13 0.40 - 1.20 mg/dL   Total Bilirubin 0.5 0.2 - 1.2 mg/dL   Alkaline Phosphatase 71 39 - 117 U/L   AST 14 5 - 37 U/L   ALT 18 3 - 35 U/L   Total Protein 8.0 6.0 - 8.3 g/dL   Albumin 4.6 3.5 - 5.2 g/dL   GFR 07.77 >39.99 mL/min   Calcium 9.4 8.4 - 10.5 mg/dL  Lipid panel   Collection Time: 09/15/24 10:11 AM  Result Value Ref Range   Cholesterol 206 (H) 28 - 200 mg/dL   Triglycerides 863.9 89.9 - 149.0 mg/dL   HDL 54.69 >60.99 mg/dL   VLDL 72.7 0.0 - 59.9 mg/dL   LDL Cholesterol 865 (H) 10 - 99 mg/dL   Total CHOL/HDL Ratio 5    NonHDL 161.03   Hemoglobin A1c   Collection Time: 09/15/24 10:11 AM  Result Value Ref Range   Hgb A1c MFr Bld 6.1 4.6 - 6.5 %  TSH   Collection Time: 09/15/24 10:11 AM  Result Value Ref Range   TSH 1.99 0.35 - 5.50 uIU/mL  T4, free   Collection Time: 09/15/24 10:11 AM  Result Value Ref Range   Free T4 0.86 0.60 - 1.60 ng/dL  VITAMIN D 25 Hydroxy (Vit-D Deficiency, Fractures)   Collection Time: 09/15/24 10:11 AM  Result Value Ref Range   VITD 12.63 (L) 30.00 - 100.00 ng/mL  Vitamin B12   Collection Time: 09/15/24 10:11 AM  Result Value Ref Range   Vitamin B-12 149 (L) 211 - 911 pg/mL  Iron , TIBC and Ferritin Panel   Collection Time: 09/15/24 10:11 AM  Result Value Ref Range   Iron  47 40 - 190 mcg/dL   TIBC 615 749 - 549 mcg/dL (calc)   %SAT 12 (L) 16 - 45 % (calc)   Ferritin 23 16 - 154 ng/mL    Screening      09/09/2024   11:44 AM 01/27/2016    1:42 PM 11/21/2015   10:06 AM  Depression screen PHQ 2/9  Decreased Interest 0 0 0  Down, Depressed, Hopeless 0 0 0  PHQ - 2 Score 0 0 0  Altered sleeping 0    Tired, decreased energy 0    Change in appetite 0    Feeling bad or failure about yourself  0    Trouble concentrating 0    Moving slowly or fidgety/restless 0    Suicidal thoughts 0    PHQ-9 Score 0     Diagnoses and Orders   1. Weight gain    2. Migraine without aura, not intractable, without status migrainosus  3. Vitamin D deficiency   4. Irregular menses   5. Medication management   6. Loud snoring   7. Attention deficit hyperactivity disorder (ADHD), predominantly inattentive type   8. Encounter for initial prescription of injectable contraceptive   9. History of anemia   10. Class 2 obesity without serious comorbidity with body mass index (BMI) of 35.0 to 35.9 in adult, unspecified obesity type    Meds ordered this encounter  Medications   medroxyPROGESTERone  (DEPO-PROVERA ) injection 150 mg   Orders Placed This Encounter  Procedures   CBC with Differential/Platelet   Comprehensive metabolic panel with GFR   Lipid panel   Hemoglobin A1c   TSH   T4, free   VITAMIN D 25 Hydroxy (Vit-D Deficiency, Fractures)   Vitamin B12   Iron , TIBC and Ferritin Panel   Ambulatory referral to Sleep Studies   POCT urine pregnancy   Assessment and Plan   Assessment and Plan Assessment & Plan Class 2 obesity Contributing to health issues, including migraines and sleep apnea. Weight management is crucial. - Ordered labs for blood sugars, liver function, kidney function, and cholesterol. - Prescribed phentermine and topiramate for weight loss. - Discussed potential for Zepbound if sleep apnea is confirmed.  Chronic migraine with aura Chronic migraines with aura, occurring almost three times a week, severe with nausea and photophobia. Possibly related to sleep issues and iron  deficiency. - Prescribed topiramate for migraine management. - Ordered labs to assess iron  levels.  Suspected obstructive sleep apnea Due to history of hypertension, snoring, and daytime fatigue. Can affect cardiovascular health and contribute to migraines. - Ordered home sleep study. - Discussed potential need for CPAP or tonsillectomy based on results.  Iron  deficiency anemia Contributing to fatigue and possibly migraines. Not taking iron   supplements due to gastrointestinal side effects. - Ordered labs to assess iron  levels. - Discussed potential for iron  infusion if oral supplementation is not tolerated.  Amenorrhea and contraceptive management Amenorrhea likely due to Depo-Provera . Prefers Depo-Provera  despite weight gain concerns. - Ordered pregnancy test before administering Depo-Provera . - Administered Depo-Provera  injection if pregnancy test was negative. - Discussed potential for IUD as a long-term contraceptive option.  Attention-deficit hyperactivity disorder ADHD previously managed with Vyvanse, discontinued due to sedation. Symptoms include sleep difficulty and impulsivity, affecting weight management. - Discussed potential for stimulant therapy if needed.  Skin hypopigmentation, left hand Likely related to previous corticosteroid injection for De Quervain's tenosynovitis. No other skin changes noted.  Vitamin D deficiency Suspected based on history and potential contribution to health issues. - Ordered labs to assess vitamin D levels.  Geni Shutter, DO, MS, FAAFP, Dipl. [facility] [address] [place], [address] Dept: [phone] Dept Fax: [phone]  Attestations   Patient is establishing care in this system with me as PCP. Available records reviewed. Chart updated today with reconciliation of problem list, medications, allergies, and relevant history. Preventive care and chronic disease status reviewed.  Outside labs reviewed and will be abstracted. Portions of historical chart may remain incomplete; will update on an ongoing basis as clinically indicated.  Reviewed by clinician on day of visit: allergies, medications, problem list, medical history, surgical history, family history, social history, and previous encounter notes. Discussed the use of AI scribe software for clinical note transcription with the patient, who gave verbal consent to proceed.  As the patient's primary care physician, board-certified in Family Medicine and Obesity Medicine, I am providing ongoing, comprehensive obesity care based on the pillars  of obesity medicine, including nutrition therapy, physical activity, behavioral modification, and pharmacologic treatment.     [1]  Outpatient Medications Prior to Visit  Medication Sig   Cholecalciferol 1.25 MG (50000 UT) capsule Take 50,000 Units by mouth daily.   ibuprofen  (ADVIL ) 600 MG tablet Take 1 tablet (600 mg total) by mouth every 6 (six) hours.   Cholecalciferol (VITAMIN D) 50 MCG (2000 UT) tablet Take 2,000 Units by mouth daily. (Patient not taking: Reported on 09/09/2024)   [DISCONTINUED] acetaminophen  (TYLENOL ) 500 MG tablet Take 1 tablet (500 mg total) by mouth every 6 (six) hours as needed. (Patient not taking: Reported on 07/24/2023)   [DISCONTINUED] cyclobenzaprine  (FLEXERIL ) 5 MG tablet Take 2 tablets (10 mg total) by mouth 2 (two) times daily as needed for muscle spasms. (Patient not taking: Reported on 07/24/2023)   [DISCONTINUED] naproxen  (NAPROSYN ) 500 MG tablet Take 1 tablet (500 mg total) by mouth 2 (two) times daily.   [DISCONTINUED] oxyCODONE  (OXY IR/ROXICODONE ) 5 MG immediate release tablet Take 1 tablet (5 mg total) by mouth every 4 (four) hours as needed for moderate pain (pain score 4-6).   No facility-administered medications prior to visit.  [2] No Known Allergies  "

## 2024-09-09 NOTE — Progress Notes (Signed)
 Per orders of Geni Shutter, DO, injection of Depo-Provera   given by Joeseph Piety, CMA in Right deltoid Patient tolerated injection well. Patient will make appointment for 3 month(s) (March 10-December 09, 2024)

## 2024-09-12 ENCOUNTER — Telehealth: Payer: Self-pay

## 2024-09-12 ENCOUNTER — Ambulatory Visit: Payer: Self-pay

## 2024-09-12 NOTE — Telephone Encounter (Signed)
 FYI Only or Action Required?: FYI only for provider: appointment scheduled on 12/30.  Patient was last seen in primary care on 09/09/2024 by Prentiss Frieze, DO.  Called Nurse Triage reporting Otalgia.  Symptoms began about a month ago.  Interventions attempted: OTC medications: ibuprofen .  Symptoms are: gradually worsening.  Triage Disposition: See Physician Within 24 Hours  Patient/caregiver understands and will follow disposition?: No, refuses disposition Would like to wait to see pcp on 12/30    1 month ago onset 10/10 throbbing constant pain in left ear, has been getting worse. Denies fever or drainage. No changes to hearing or vision. Taking ibuprofen  which helps pain. Soonest appt with pcp scheduled on 12/30. Declines UC or sooner appt with different provider. Advised UC or ED for worsening symptoms.    Copied from CRM [id number]. Topic: Clinical - Red Word Triage >> Sep 12, 2024 10:24 AM Robinson H wrote: [facility] that prompted transfer to Nurse Triage: Pain from ear down to jaw on left side, really bad migraine, usually has migraines but pain is new. Also was asking about lab results but provider hasn't signed off on results yet. Reason for Disposition  Earache  (Exceptions: Brief ear pain of lasting less than 60 minutes, or earache occurring during air travel.)  Answer Assessment - Initial Assessment Questions 1. LOCATION: Which ear is involved?     Left ear, radiates to left jaw  2. ONSET: When did the ear pain start?      1 month ago  3. SEVERITY: How bad is the pain?  (Scale 1-10; mild, moderate or severe)     10/10 throbbing pain  4. URI SYMPTOMS: Do you have a runny nose or cough?     Denies  5. FEVER: Do you have a fever? If Yes, ask: What is your temperature, how was it measured, and when did it start?     Denies  6. CAUSE: Have you been swimming recently?, How often do you use Q-TIPS?, Have you had any recent air travel or scuba diving?      Denies  7. OTHER SYMPTOMS: Do you have any other symptoms? (e.g., decreased hearing, dizziness, headache, stiff neck, vomiting)     Denies  8. PREGNANCY: Is there any chance you are pregnant? When was your last menstrual period?     Denies, just started depo provera   Protocols used: [place]

## 2024-09-12 NOTE — Telephone Encounter (Signed)
 Patient didn't get her blood work done on that visit.  Scheduled her to come back on 08/16/24 for labs. Dm/cma

## 2024-09-12 NOTE — Telephone Encounter (Signed)
 Copied from CRM #[id number]. Topic: Clinical - Lab/Test Results >> Sep 12, 2024 10:33 AM Robinson H wrote: Reason for CRM: Patient following up on lab results, no messages or notes to relay from provider.  Paulene [phone]

## 2024-09-15 ENCOUNTER — Other Ambulatory Visit

## 2024-09-15 DIAGNOSIS — E559 Vitamin D deficiency, unspecified: Secondary | ICD-10-CM

## 2024-09-15 LAB — HEMOGLOBIN A1C: Hgb A1c MFr Bld: 6.1 % (ref 4.6–6.5)

## 2024-09-15 LAB — CBC WITH DIFFERENTIAL/PLATELET
Basophils Absolute: 0 K/uL (ref 0.0–0.1)
Basophils Relative: 0.5 % (ref 0.0–3.0)
Eosinophils Absolute: 0.2 K/uL (ref 0.0–0.7)
Eosinophils Relative: 2.2 % (ref 0.0–5.0)
HCT: 40.1 % (ref 36.0–46.0)
Hemoglobin: 13 g/dL (ref 12.0–15.0)
Lymphocytes Relative: 31.6 % (ref 12.0–46.0)
Lymphs Abs: 2.3 K/uL (ref 0.7–4.0)
MCHC: 32.5 g/dL (ref 30.0–36.0)
MCV: 80.4 fl (ref 78.0–100.0)
Monocytes Absolute: 0.5 K/uL (ref 0.1–1.0)
Monocytes Relative: 7.1 % (ref 3.0–12.0)
Neutro Abs: 4.2 K/uL (ref 1.4–7.7)
Neutrophils Relative %: 58.6 % (ref 43.0–77.0)
Platelets: 371 K/uL (ref 150.0–400.0)
RBC: 4.99 Mil/uL (ref 3.87–5.11)
RDW: 15.3 % (ref 11.5–15.5)
WBC: 7.2 K/uL (ref 4.0–10.5)

## 2024-09-15 LAB — COMPREHENSIVE METABOLIC PANEL WITH GFR
ALT: 18 U/L (ref 3–35)
AST: 14 U/L (ref 5–37)
Albumin: 4.6 g/dL (ref 3.5–5.2)
Alkaline Phosphatase: 71 U/L (ref 39–117)
BUN: 10 mg/dL (ref 6–23)
CO2: 26 meq/L (ref 19–32)
Calcium: 9.4 mg/dL (ref 8.4–10.5)
Chloride: 104 meq/L (ref 96–112)
Creatinine, Ser: 0.86 mg/dL (ref 0.40–1.20)
GFR: 92.22 mL/min
Glucose, Bld: 86 mg/dL (ref 70–99)
Potassium: 3.9 meq/L (ref 3.5–5.1)
Sodium: 139 meq/L (ref 135–145)
Total Bilirubin: 0.5 mg/dL (ref 0.2–1.2)
Total Protein: 8 g/dL (ref 6.0–8.3)

## 2024-09-15 LAB — LIPID PANEL
Cholesterol: 206 mg/dL — ABNORMAL HIGH (ref 28–200)
HDL: 45.3 mg/dL
LDL Cholesterol: 134 mg/dL — ABNORMAL HIGH (ref 10–99)
NonHDL: 161.03
Total CHOL/HDL Ratio: 5
Triglycerides: 136 mg/dL (ref 10.0–149.0)
VLDL: 27.2 mg/dL (ref 0.0–40.0)

## 2024-09-15 LAB — T4, FREE: Free T4: 0.86 ng/dL (ref 0.60–1.60)

## 2024-09-15 LAB — VITAMIN B12: Vitamin B-12: 149 pg/mL — ABNORMAL LOW (ref 211–911)

## 2024-09-15 LAB — TSH: TSH: 1.99 u[IU]/mL (ref 0.35–5.50)

## 2024-09-15 LAB — VITAMIN D 25 HYDROXY (VIT D DEFICIENCY, FRACTURES): VITD: 12.63 ng/mL — ABNORMAL LOW (ref 30.00–100.00)

## 2024-09-16 ENCOUNTER — Encounter: Payer: Self-pay | Admitting: Family Medicine

## 2024-09-16 ENCOUNTER — Ambulatory Visit: Payer: Self-pay | Admitting: Family Medicine

## 2024-09-16 ENCOUNTER — Ambulatory Visit (INDEPENDENT_AMBULATORY_CARE_PROVIDER_SITE_OTHER): Admitting: Family Medicine

## 2024-09-16 VITALS — BP 126/82 | HR 76 | Ht 64.0 in | Wt 212.2 lb

## 2024-09-16 DIAGNOSIS — H9202 Otalgia, left ear: Secondary | ICD-10-CM

## 2024-09-16 DIAGNOSIS — E538 Deficiency of other specified B group vitamins: Secondary | ICD-10-CM

## 2024-09-16 DIAGNOSIS — R790 Abnormal level of blood mineral: Secondary | ICD-10-CM

## 2024-09-16 DIAGNOSIS — E559 Vitamin D deficiency, unspecified: Secondary | ICD-10-CM

## 2024-09-16 LAB — IRON,TIBC AND FERRITIN PANEL
%SAT: 12 % — ABNORMAL LOW (ref 16–45)
Ferritin: 23 ng/mL (ref 16–154)
Iron: 47 ug/dL (ref 40–190)
TIBC: 384 ug/dL (ref 250–450)

## 2024-09-16 MED ORDER — TOPIRAMATE 50 MG PO TABS: 50.0000 mg | ORAL_TABLET | Freq: Every day | ORAL | 1 refills | Status: AC

## 2024-09-16 MED ORDER — PRENATAL VITAMIN 27-0.8 MG PO TABS: 1.0000 | ORAL_TABLET | Freq: Every day | ORAL | 2 refills | Status: AC

## 2024-09-16 MED ORDER — PHENTERMINE HCL 37.5 MG PO TABS: 37.5000 mg | ORAL_TABLET | Freq: Every day | ORAL | 0 refills | Status: AC

## 2024-09-16 MED ORDER — AMOXICILLIN 875 MG PO TABS: 875.0000 mg | ORAL_TABLET | Freq: Two times a day (BID) | ORAL | 0 refills | Status: AC

## 2024-09-16 NOTE — Telephone Encounter (Signed)
 Spoke with patient during her visit today.

## 2024-09-16 NOTE — Progress Notes (Signed)
 "  The patient is being seen today for an acute visit.  Patient Care Team: Prentiss Frieze, DO as PCP - General (Family Medicine) Hands, Minta, NP as PCP - OBGYN (Obstetrics and Gynecology)   Subjective:   Chief Complaint  Patient presents with   Ear Pain    Left ear pain for 1 month radiates to jaw     Review of Systems: Negative, with the exception of above mentioned in HPI.  History:   Reviewed by clinician on day of visit: allergies, medications, problem list, medical history, surgical history, family history, social history, and previous encounter notes.  Medications:   Active Medications[1] Allergies[2]  Objective:   BP 126/82 (BP Location: Right Arm, Patient Position: Sitting, Cuff Size: Normal)   Pulse 76   Ht 5' 4 (1.626 m)   Wt 212 lb 3.2 oz (96.3 kg)   LMP 09/09/2024 (Approximate)   SpO2 98%   BMI 36.42 kg/m   Physical Exam Constitutional:      General: She is not in acute distress.    Appearance: She is well-developed.  HENT:     Head: Normocephalic and atraumatic.     Comments: Left otalgia with pain reproduced by tragus manipulation and focal anterior tenderness. Otoscopic exam normal (TM and canal). No cervical or preauricular lymphadenopathy. No obvious dental pathology. Eyes:     Conjunctiva/sclera: Conjunctivae normal.  Cardiovascular:     Rate and Rhythm: Normal rate and regular rhythm.     Heart sounds: Normal heart sounds.  Pulmonary:     Effort: Pulmonary effort is normal.     Breath sounds: Normal breath sounds.  Neurological:     General: No focal deficit present.     Mental Status: She is alert.  Psychiatric:        Behavior: Behavior normal.       Assessment & Plan:   Left otalgia with dull, constant pain, reproducible with tragus manipulation and focal anterior tenderness. Otoscopic exam normal (TM and canal). No cervical or preauricular lymphadenopathy. No obvious dental pathology on exam, though posterior or deep odontogenic  source cannot be excluded. Overall picture most consistent with referred otalgia (TMJ/myofascial pain most likely), but differential includes occult dental infection and deep space infection given constant pain pattern.  Plan Empiric antibiotics: Started amoxicillin to cover possible occult odontogenic source not visible on exam. Pain control: NSAIDs or acetaminophen  as needed if no contraindications. Supportive care: Warm compresses; avoid excessive jaw movement, gum chewing, or clenching. Monitoring / red flags: Reviewed warning signs of deep or progressive infection including fever, worsening or severe pain, trismus, facial swelling, dysphagia, voice changes, otorrhea, hearing loss, or neurologic symptoms. Follow-up: Reassess if no improvement within 48-72 hours or sooner if symptoms worsen; consider dental evaluation, imaging, or ENT referral if persistent or progressive.  Frieze Prentiss, DO, MS, FAAFP, Dipl. [facility] [address] [place], [address] Dept: [phone] Dept Fax: [phone]     [1]  Current Meds  Medication Sig   amoxicillin (AMOXIL) 875 MG tablet Take 1 tablet (875 mg total) by mouth 2 (two) times daily for 10 days.   ibuprofen  (ADVIL ) 600 MG tablet Take 1 tablet (600 mg total) by mouth every 6 (six) hours.   phentermine (ADIPEX-P) 37.5 MG tablet Take 1 tablet (37.5 mg total) by mouth daily. Take 1/2 tab q am x 2 weeks, then may increase to 1 tab.   topiramate (TOPAMAX) 50 MG tablet Take 1 tablet (50 mg  total) by mouth daily.  [2] No Known Allergies  "

## 2024-09-29 NOTE — Telephone Encounter (Signed)
 I have called and spoken with pts pharmacist. They will get pts scripts ready for her again. Her scripts were ready for her days ago but she didn't pick them up in over a week so they were put back on the shelf. I have sent pt a message letting her know that they are being processed and she can pick them up this afternoon.

## 2024-10-06 NOTE — Progress Notes (Unsigned)
 "    Patient Care Team: Prentiss Frieze, DO as PCP - General (Family Medicine) Hands, Kylie, NP as PCP - OBGYN (Obstetrics and Gynecology)  Weight Management:   Starting weight: 204 lbs Starting date: 09/09/24 Today's weight: 204 lbs Today's date: 10/07/2024 Total lbs lost to date: 0 lbs Body mass index is 35.02 kg/m.   Nutrition Plan: Eat protein with every meal/snack , Prioritize whole, minimally processed foods , and Mindful portions. Anti-obesity medications (including off-label): Phentermine -topiramate . Reported side effects: None. Hunger is moderately controlled. Cravings are moderately controlled. Activity: For substantial health benefits, adults should do at least 150 minutes (2 hours and 30 minutes) a week of moderate-intensity, or 75 minutes (1 hour and 15 minutes) a week of vigorous-intensity aerobic physical activity, or an equivalent combination of moderate- and vigorous-intensity aerobic activity. Aerobic activity should be performed in episodes of at least 10 minutes, and preferably, it should be spread throughout the week.. Sleep: Number of hours slept each night: adequate. Sleep is not restful.   Frieze Prentiss, DO, MS (Nutrition), FAAFP, Dipl. ABOM Fellow, American Academy of Family Physicians Diplomate, American Board of Obesity Medicine [facility] at [facility] [address] [place], [place] [address] Dept: [phone] Fax: [phone]  Diagnoses and Orders:   1. Acute bilateral low back pain with bilateral sciatica   2. B12 deficiency   3. Decreased ferritin   4. Vitamin D  deficiency   5. Loud snoring   6. Class 2 obesity without serious comorbidity with body mass index (BMI) of 35.0 to 35.9 in adult, unspecified obesity type    Meds ordered this encounter  Medications   methylPREDNISolone  (MEDROL  DOSEPAK) 4 MG TBPK tablet    Sig: Take per package instructions.    Dispense:  21 tablet    Refill:  0    cyanocobalamin  (VITAMIN B12) injection 1,000 mcg   semaglutide -weight management (WEGOVY ) 1.5 MG tablet    Sig: Take 1 tablet (1.5 mg total) by mouth daily. Daily in AM on an empty stomach with 4 oz of water. Do not eat or drink for 30 minutes after dose.    Dispense:  30 tablet    Refill:  1   Orders Placed This Encounter  Procedures   DG Lumbar Spine Complete   Assessment & Plan:   Assessment & Plan Lower Back Pain Severe pain affecting mobility, possibly piriformis syndrome with sciatic involvement. Prednisone  discussed for pain relief and diagnostic clarity. - Order lumbar spine x-ray. - Prescribe prednisone  taper pack. - Advise holding phentermine  and ibuprofen  while on prednisone . - Discuss potential piriformis syndrome and recommend stretching exercises.  Vitamin B12 Deficiency Severely low B12 level at 149, potentially causing fatigue and neurological symptoms. B12 supplementation discussed for symptom relief. - Administer B12 injection. - Advise daily oral B12 supplementation.  Iron  Deficiency Low ferritin level at 23, indicating low iron  stores. Importance of iron  supplementation discussed. - Advise starting prenatal vitamins with iron . - Continue vitamin D  supplementation.  Prediabetes A1c at 6.1 indicates prediabetes. Discussed weight management and blood glucose monitoring. Consideration of Wegovy  if insurance covers. - Continue weight management plan with phentermine  and topiramate . - Consider Wegovy  if insurance covers.  Hyperlipidemia Elevated LDL cholesterol. Discussed weight management's role in addressing hyperlipidemia. - Continue weight management plan.  General Health Maintenance Discussed maintaining adequate vitamin D  and B12 levels, importance of iron  supplementation, and weight management. - Review labs at next visit. - Recommend using [facility] for better coordination. - Will change  referral   Follow-up Need for follow-up to assess  treatment response and further evaluate back pain. Emphasized monitoring response to B12 supplementation and weight management plan. - Follow up after x-ray results and prednisone  course.  Subjective:   History of Present Illness Monique Keith is a 29 year old female who presents with severe lower back pain.  Severe lower back pain - Severe lower back pain for the past several months with progressive worsening - Pain is excruciating and radiates across the entire lower back - Pain is aggravated by sitting, driving, and lifting either leg - Requires assistance from her mother to get out of the car or off the toilet - Significantly limits walking, sitting, and lifting legs - No recent back injury - No urinary symptoms - Pain significantly impairs ability to care for her child and manage daily activities - Tried meloxicam and her mother's muscle relaxers without relief - Family history of uncle requiring back surgery for a disc problem  Medication and supplement use - Currently taking phentermine  and topiramate  for weight management - Received a Depo-Provera  injection - Started prenatal vitamin, iron , and vitamin D  supplementation due to recent laboratory findings - Experienced difficulty obtaining medications from the pharmacy, which was eventually resolved  Laboratory abnormalities - Recent laboratory results showed iron  deficiency (ferritin 23), vitamin D  deficiency (vitamin D  12), and low vitamin B12 (B12 149) - Elevated LDL cholesterol - Hemoglobin A1c of 6.1  Review of Systems: Negative, with the exception of above mentioned in HPI.  History:   Reviewed by clinician on day of visit: allergies, medications, problem list, medical history, surgical history, family history, social history, and previous encounter notes.  Medications:   Show/hide medication list[1] Allergies[2]  Objective:   BP 116/72   Pulse 77   Temp 97.7 F (36.5 C) (Oral)   Ht 5' 4 (1.626 m)   Wt  204 lb (92.5 kg)   LMP 09/09/2024 (Approximate)   SpO2 99%   BMI 35.02 kg/m   Physical Exam Constitutional:      General: She is not in acute distress.    Appearance: She is well-developed.  HENT:     Head: Normocephalic and atraumatic.  Eyes:     Conjunctiva/sclera: Conjunctivae normal.  Cardiovascular:     Rate and Rhythm: Normal rate and regular rhythm.     Heart sounds: Normal heart sounds.  Pulmonary:     Effort: Pulmonary effort is normal.     Breath sounds: Normal breath sounds.  Musculoskeletal:     Lumbar back: Spasms present.     Comments: Generalized ttp along lumbar paraspinal mm, central piriformis.  Neurological:     General: No focal deficit present.     Mental Status: She is alert.  Psychiatric:        Behavior: Behavior normal.     Results for orders placed or performed in visit on 09/09/24  POCT urine pregnancy   Collection Time: 09/09/24 12:48 PM  Result Value Ref Range   Preg Test, Ur Negative Negative  CBC with Differential/Platelet   Collection Time: 09/15/24 10:11 AM  Result Value Ref Range   WBC 7.2 4.0 - 10.5 K/uL   RBC 4.99 3.87 - 5.11 Mil/uL   Hemoglobin 13.0 12.0 - 15.0 g/dL   HCT 59.8 63.9 - 53.9 %   MCV 80.4 78.0 - 100.0 fl   MCHC 32.5 30.0 - 36.0 g/dL   RDW 84.6 88.4 - 84.4 %   Platelets 371.0 150.0 - 400.0 K/uL  Neutrophils Relative % 58.6 43.0 - 77.0 %   Lymphocytes Relative 31.6 12.0 - 46.0 %   Monocytes Relative 7.1 3.0 - 12.0 %   Eosinophils Relative 2.2 0.0 - 5.0 %   Basophils Relative 0.5 0.0 - 3.0 %   Neutro Abs 4.2 1.4 - 7.7 K/uL   Lymphs Abs 2.3 0.7 - 4.0 K/uL   Monocytes Absolute 0.5 0.1 - 1.0 K/uL   Eosinophils Absolute 0.2 0.0 - 0.7 K/uL   Basophils Absolute 0.0 0.0 - 0.1 K/uL  Comprehensive metabolic panel with GFR   Collection Time: 09/15/24 10:11 AM  Result Value Ref Range   Sodium 139 135 - 145 mEq/L   Potassium 3.9 3.5 - 5.1 mEq/L   Chloride 104 96 - 112 mEq/L   CO2 26 19 - 32 mEq/L   Glucose, Bld 86 70  - 99 mg/dL   BUN 10 6 - 23 mg/dL   Creatinine, Ser 9.13 0.40 - 1.20 mg/dL   Total Bilirubin 0.5 0.2 - 1.2 mg/dL   Alkaline Phosphatase 71 39 - 117 U/L   AST 14 5 - 37 U/L   ALT 18 3 - 35 U/L   Total Protein 8.0 6.0 - 8.3 g/dL   Albumin 4.6 3.5 - 5.2 g/dL   GFR 07.77 >39.99 mL/min   Calcium 9.4 8.4 - 10.5 mg/dL  Lipid panel   Collection Time: 09/15/24 10:11 AM  Result Value Ref Range   Cholesterol 206 (H) 28 - 200 mg/dL   Triglycerides 863.9 89.9 - 149.0 mg/dL   HDL 54.69 >60.99 mg/dL   VLDL 72.7 0.0 - 59.9 mg/dL   LDL Cholesterol 865 (H) 10 - 99 mg/dL   Total CHOL/HDL Ratio 5    NonHDL 161.03   Hemoglobin A1c   Collection Time: 09/15/24 10:11 AM  Result Value Ref Range   Hgb A1c MFr Bld 6.1 4.6 - 6.5 %  TSH   Collection Time: 09/15/24 10:11 AM  Result Value Ref Range   TSH 1.99 0.35 - 5.50 uIU/mL  T4, free   Collection Time: 09/15/24 10:11 AM  Result Value Ref Range   Free T4 0.86 0.60 - 1.60 ng/dL  VITAMIN D  25 Hydroxy (Vit-D Deficiency, Fractures)   Collection Time: 09/15/24 10:11 AM  Result Value Ref Range   VITD 12.63 (L) 30.00 - 100.00 ng/mL  Vitamin B12   Collection Time: 09/15/24 10:11 AM  Result Value Ref Range   Vitamin B-12 149 (L) 211 - 911 pg/mL  Iron , TIBC and Ferritin Panel   Collection Time: 09/15/24 10:11 AM  Result Value Ref Range   Iron  47 40 - 190 mcg/dL   TIBC 615 749 - 549 mcg/dL (calc)   %SAT 12 (L) 16 - 45 % (calc)   Ferritin 23 16 - 154 ng/mL    Attestations:   Reviewed by clinician on day of visit: allergies, medications, problem list, medical history, surgical history, family history, social history, and previous encounter notes. Discussed the use of AI scribe software for clinical note transcription with the patient, who gave verbal consent to proceed. As the patient's primary care physician, board-certified in Family Medicine and Obesity Medicine, I am providing ongoing, comprehensive obesity care based on the pillars of obesity medicine,  including nutrition therapy, physical activity, behavioral modification, and pharmacologic treatment.  Geni Shutter, DO, MS, FAAFP, Dipl. [facility] at [facility] [address] [place], [address] Dept: [phone] Dept Fax: [phone]     [1]  Outpatient Medications Prior  to Visit  Medication Sig   ibuprofen  (ADVIL ) 600 MG tablet Take 1 tablet (600 mg total) by mouth every 6 (six) hours. (Patient taking differently: Take 600 mg by mouth as needed for headache.)   phentermine  (ADIPEX-P ) 37.5 MG tablet Take 1 tablet (37.5 mg total) by mouth daily. Take 1/2 tab q am x 2 weeks, then may increase to 1 tab.   Prenatal Vit-Fe Fumarate-FA (PRENATAL VITAMIN) 27-0.8 MG TABS Take 1 tablet by mouth daily at 12 noon.   topiramate  (TOPAMAX ) 50 MG tablet Take 1 tablet (50 mg total) by mouth daily.   Cholecalciferol 1.25 MG (50000 UT) capsule Take 50,000 Units by mouth daily. (Patient not taking: Reported on 10/07/2024)   No facility-administered medications prior to visit.  [2] No Known Allergies  "

## 2024-10-07 ENCOUNTER — Ambulatory Visit: Admitting: Family Medicine

## 2024-10-07 ENCOUNTER — Encounter: Payer: Self-pay | Admitting: Family Medicine

## 2024-10-07 ENCOUNTER — Ambulatory Visit

## 2024-10-07 VITALS — BP 116/72 | HR 77 | Temp 97.7°F | Ht 64.0 in | Wt 204.0 lb

## 2024-10-07 DIAGNOSIS — E538 Deficiency of other specified B group vitamins: Secondary | ICD-10-CM

## 2024-10-07 DIAGNOSIS — E66812 Obesity, class 2: Secondary | ICD-10-CM | POA: Diagnosis not present

## 2024-10-07 DIAGNOSIS — M5441 Lumbago with sciatica, right side: Secondary | ICD-10-CM | POA: Diagnosis not present

## 2024-10-07 DIAGNOSIS — M5442 Lumbago with sciatica, left side: Secondary | ICD-10-CM

## 2024-10-07 DIAGNOSIS — E559 Vitamin D deficiency, unspecified: Secondary | ICD-10-CM | POA: Diagnosis not present

## 2024-10-07 DIAGNOSIS — Z6835 Body mass index (BMI) 35.0-35.9, adult: Secondary | ICD-10-CM | POA: Diagnosis not present

## 2024-10-07 DIAGNOSIS — R790 Abnormal level of blood mineral: Secondary | ICD-10-CM | POA: Diagnosis not present

## 2024-10-07 DIAGNOSIS — R0683 Snoring: Secondary | ICD-10-CM | POA: Diagnosis not present

## 2024-10-07 MED ORDER — METHYLPREDNISOLONE 4 MG PO TBPK: ORAL_TABLET | ORAL | 0 refills | Status: AC

## 2024-10-07 MED ORDER — SEMAGLUTIDE-WEIGHT MANAGEMENT 1.5 MG PO TABS: 1.5000 mg | ORAL_TABLET | Freq: Every day | ORAL | 1 refills | Status: AC

## 2024-10-07 MED ORDER — CYANOCOBALAMIN 1000 MCG/ML IJ SOLN
1000.0000 ug | Freq: Once | INTRAMUSCULAR | Status: AC
  Administered 2024-10-07: 1000 ug via INTRAMUSCULAR

## 2024-10-07 NOTE — Telephone Encounter (Signed)
 Pt has not yet heard from [facility] to set up her sleep study. Dr Prentiss has been made aware that they have moved offices. She would like to move the referral. Please send referral to [facility], Dr Olena. Thank you

## 2024-10-07 NOTE — Patient Instructions (Signed)
" °  VISIT SUMMARY: During your visit, we addressed your severe lower back pain, vitamin deficiencies, prediabetes, and elevated cholesterol. We discussed treatment options and made plans to manage these issues effectively.  YOUR PLAN: LOWER BACK PAIN: You have severe lower back pain that is affecting your mobility and daily activities. -We will order a lumbar spine x-ray to investigate the cause of your pain. -You will start a prednisone  taper pack for pain relief. -Please hold off on taking phentermine  and ibuprofen  while you are on prednisone . -We discussed the possibility of piriformis syndrome and recommended stretching exercises.  VITAMIN B12 DEFICIENCY: Your B12 levels are very low, which can cause fatigue and other symptoms. -You received a B12 injection today. -Please take daily oral B12 supplements.  IRON  DEFICIENCY: Your iron  levels are low, which can affect your energy levels and overall health. -Start taking prenatal vitamins with iron . -Continue taking vitamin D  supplements.  PREDIABETES: Your A1c level indicates prediabetes, which means your blood sugar levels are higher than normal. -Continue your weight management plan with phentermine  and topiramate . -We will consider adding Wegovy  if your insurance covers it.  HYPERLIPIDEMIA: Your LDL cholesterol levels are elevated. -Continue with your weight management plan to help lower your cholesterol.  GENERAL HEALTH MAINTENANCE: We discussed the importance of maintaining adequate vitamin D  and B12 levels, iron  supplementation, and weight management. -We will review your lab results at your next visit. -Consider using [facility] pharmacies for better coordination of your medications.  FOLLOW-UP: We need to monitor your response to the treatments and further evaluate your back pain. -Follow up after your x-ray results and prednisone  course.  "

## 2024-10-07 NOTE — Telephone Encounter (Signed)
 Noted. Thanks.

## 2024-10-14 ENCOUNTER — Telehealth: Payer: Self-pay | Admitting: Pharmacist

## 2024-10-14 ENCOUNTER — Other Ambulatory Visit (HOSPITAL_COMMUNITY): Payer: Self-pay

## 2024-10-14 NOTE — Telephone Encounter (Signed)
 Pharmacy Patient Advocate Encounter   Received notification from [facility] that prior authorization for Wegovy  1.5MG  tablets  is required/requested.   Insurance verification completed.   The patient is insured through [employer].   Per test claim: PA required; PA submitted to above mentioned insurance via Latent Key/confirmation #/EOC [id number] Status is pending

## 2024-10-14 NOTE — Telephone Encounter (Signed)
 Pharmacy Patient Advocate Encounter  Received notification from [employer] that Prior Authorization for Wegovy  1.5mg  Tablets has been APPROVED from 10/14/2024 to 04/12/2025   PA #/Case ID/Reference #: [id number]

## 2024-10-15 NOTE — Telephone Encounter (Signed)
 Patient has been scheduled for 2/17 with Dr.Stretch.  Nothing further needed.

## 2024-11-04 ENCOUNTER — Ambulatory Visit: Admitting: Pulmonary Disease

## 2024-11-18 ENCOUNTER — Ambulatory Visit: Admitting: Family Medicine

## 2024-12-11 ENCOUNTER — Ambulatory Visit
